# Patient Record
Sex: Female | Born: 1990 | Race: White | Hispanic: No | Marital: Single | Smoking: Former smoker
Health system: Southern US, Community
[De-identification: ages and names within clinical notes are randomized; demographics above are authoritative.]

## PROBLEM LIST (undated history)

## (undated) DIAGNOSIS — N926 Irregular menstruation, unspecified: Secondary | ICD-10-CM

## (undated) DIAGNOSIS — E669 Obesity, unspecified: Secondary | ICD-10-CM

## (undated) DIAGNOSIS — Z1371 Encounter for nonprocreative screening for genetic disease carrier status: Secondary | ICD-10-CM

## (undated) DIAGNOSIS — R87620 Atypical squamous cells of undetermined significance on cytologic smear of vagina (ASC-US): Secondary | ICD-10-CM

## (undated) DIAGNOSIS — F32A Depression, unspecified: Secondary | ICD-10-CM

## (undated) DIAGNOSIS — E282 Polycystic ovarian syndrome: Secondary | ICD-10-CM

## (undated) DIAGNOSIS — L680 Hirsutism: Secondary | ICD-10-CM

## (undated) DIAGNOSIS — F419 Anxiety disorder, unspecified: Secondary | ICD-10-CM

## (undated) DIAGNOSIS — F329 Major depressive disorder, single episode, unspecified: Secondary | ICD-10-CM

## (undated) DIAGNOSIS — Z8041 Family history of malignant neoplasm of ovary: Secondary | ICD-10-CM

## (undated) DIAGNOSIS — L0292 Furuncle, unspecified: Secondary | ICD-10-CM

## (undated) HISTORY — DX: Irregular menstruation, unspecified: N92.6

## (undated) HISTORY — DX: Furuncle, unspecified: L02.92

## (undated) HISTORY — DX: Family history of malignant neoplasm of ovary: Z80.41

## (undated) HISTORY — PX: TONSILLECTOMY AND ADENOIDECTOMY: SHX28

## (undated) HISTORY — DX: Hirsutism: L68.0

## (undated) HISTORY — DX: Anxiety disorder, unspecified: F41.9

## (undated) HISTORY — DX: Encounter for nonprocreative screening for genetic disease carrier status: Z13.71

## (undated) HISTORY — DX: Major depressive disorder, single episode, unspecified: F32.9

## (undated) HISTORY — DX: Atypical squamous cells of undetermined significance on cytologic smear of vagina (ASC-US): R87.620

## (undated) HISTORY — DX: Obesity, unspecified: E66.9

## (undated) HISTORY — DX: Depression, unspecified: F32.A

## (undated) SURGERY — LAPAROSCOPIC CHOLECYSTECTOMY WITH INTRAOPERATIVE CHOLANGIOGRAM
Anesthesia: General

---

## 2013-08-25 ENCOUNTER — Ambulatory Visit: Payer: Self-pay | Admitting: Emergency Medicine

## 2013-10-27 LAB — CBC AND DIFFERENTIAL
HCT: 38 % (ref 36–46)
HEMOGLOBIN: 12.4 g/dL (ref 12.0–16.0)
PLATELETS: 275 10*3/uL (ref 150–399)
WBC: 8.5 10*3/mL

## 2013-10-27 LAB — BASIC METABOLIC PANEL
BUN: 6 mg/dL (ref 4–21)
CREATININE: 0.9 mg/dL (ref 0.5–1.1)
Glucose: 87 mg/dL
Potassium: 4.6 mmol/L (ref 3.4–5.3)
Sodium: 138 mmol/L (ref 137–147)

## 2013-10-27 LAB — HEPATIC FUNCTION PANEL
ALT: 27 U/L (ref 7–35)
AST: 23 U/L (ref 13–35)

## 2015-03-06 DIAGNOSIS — Z1371 Encounter for nonprocreative screening for genetic disease carrier status: Secondary | ICD-10-CM

## 2015-03-06 HISTORY — DX: Encounter for nonprocreative screening for genetic disease carrier status: Z13.71

## 2015-05-05 ENCOUNTER — Encounter: Payer: Self-pay | Admitting: Physician Assistant

## 2015-05-05 ENCOUNTER — Ambulatory Visit (INDEPENDENT_AMBULATORY_CARE_PROVIDER_SITE_OTHER): Payer: BLUE CROSS/BLUE SHIELD | Admitting: Physician Assistant

## 2015-05-05 VITALS — BP 120/88 | HR 99 | Temp 97.9°F | Resp 16 | Wt 300.8 lb

## 2015-05-05 DIAGNOSIS — J069 Acute upper respiratory infection, unspecified: Secondary | ICD-10-CM

## 2015-05-05 DIAGNOSIS — H66002 Acute suppurative otitis media without spontaneous rupture of ear drum, left ear: Secondary | ICD-10-CM | POA: Diagnosis not present

## 2015-05-05 DIAGNOSIS — F329 Major depressive disorder, single episode, unspecified: Secondary | ICD-10-CM | POA: Insufficient documentation

## 2015-05-05 DIAGNOSIS — N926 Irregular menstruation, unspecified: Secondary | ICD-10-CM | POA: Insufficient documentation

## 2015-05-05 DIAGNOSIS — F32A Depression, unspecified: Secondary | ICD-10-CM | POA: Insufficient documentation

## 2015-05-05 DIAGNOSIS — R05 Cough: Secondary | ICD-10-CM | POA: Diagnosis not present

## 2015-05-05 DIAGNOSIS — R059 Cough, unspecified: Secondary | ICD-10-CM

## 2015-05-05 DIAGNOSIS — O039 Complete or unspecified spontaneous abortion without complication: Secondary | ICD-10-CM | POA: Insufficient documentation

## 2015-05-05 MED ORDER — AMOXICILLIN 875 MG PO TABS: 875.0000 mg | ORAL_TABLET | Freq: Two times a day (BID) | ORAL | Status: DC

## 2015-05-05 MED ORDER — HYDROCODONE-HOMATROPINE 5-1.5 MG/5ML PO SYRP: 5.0000 mL | ORAL_SOLUTION | Freq: Three times a day (TID) | ORAL | Status: DC | PRN

## 2015-05-05 NOTE — Patient Instructions (Signed)
Upper Respiratory Infection, Adult Most upper respiratory infections (URIs) are a viral infection of the air passages leading to the lungs. A URI affects the nose, throat, and upper air passages. The most common type of URI is nasopharyngitis and is typically referred to as "the common cold." URIs run their course and usually go away on their own. Most of the time, a URI does not require medical attention, but sometimes a bacterial infection in the upper airways can follow a viral infection. This is called a secondary infection. Sinus and middle ear infections are common types of secondary upper respiratory infections. Bacterial pneumonia can also complicate a URI. A URI can worsen asthma and chronic obstructive pulmonary disease (COPD). Sometimes, these complications can require emergency medical care and may be life threatening.  CAUSES Almost all URIs are caused by viruses. A virus is a type of germ and can spread from one person to another.  RISKS FACTORS You may be at risk for a URI if:   You smoke.   You have chronic heart or lung disease.  You have a weakened defense (immune) system.   You are very young or very old.   You have nasal allergies or asthma.  You work in crowded or poorly ventilated areas.  You work in health care facilities or schools. SIGNS AND SYMPTOMS  Symptoms typically develop 2-3 days after you come in contact with a cold virus. Most viral URIs last 7-10 days. However, viral URIs from the influenza virus (flu virus) can last 14-18 days and are typically more severe. Symptoms may include:   Runny or stuffy (congested) nose.   Sneezing.   Cough.   Sore throat.   Headache.   Fatigue.   Fever.   Loss of appetite.   Pain in your forehead, behind your eyes, and over your cheekbones (sinus pain).  Muscle aches.  DIAGNOSIS  Your health care provider may diagnose a URI by:  Physical exam.  Tests to check that your symptoms are not due to  another condition such as:  Strep throat.  Sinusitis.  Pneumonia.  Asthma. TREATMENT  A URI goes away on its own with time. It cannot be cured with medicines, but medicines may be prescribed or recommended to relieve symptoms. Medicines may help:  Reduce your fever.  Reduce your cough.  Relieve nasal congestion. HOME CARE INSTRUCTIONS   Take medicines only as directed by your health care provider.   Gargle warm saltwater or take cough drops to comfort your throat as directed by your health care provider.  Use a warm mist humidifier or inhale steam from a shower to increase air moisture. This may make it easier to breathe.  Drink enough fluid to keep your urine clear or pale yellow.   Eat soups and other clear broths and maintain good nutrition.   Rest as needed.   Return to work when your temperature has returned to normal or as your health care provider advises. You may need to stay home longer to avoid infecting others. You can also use a face mask and careful hand washing to prevent spread of the virus.  Increase the usage of your inhaler if you have asthma.   Do not use any tobacco products, including cigarettes, chewing tobacco, or electronic cigarettes. If you need help quitting, ask your health care provider. PREVENTION  The best way to protect yourself from getting a cold is to practice good hygiene.   Avoid oral or hand contact with people with cold   symptoms.   Wash your hands often if contact occurs.  There is no clear evidence that vitamin C, vitamin E, echinacea, or exercise reduces the chance of developing a cold. However, it is always recommended to get plenty of rest, exercise, and practice good nutrition.  SEEK MEDICAL CARE IF:   You are getting worse rather than better.   Your symptoms are not controlled by medicine.   You have chills.  You have worsening shortness of breath.  You have brown or red mucus.  You have yellow or brown nasal  discharge.  You have pain in your face, especially when you bend forward.  You have a fever.  You have swollen neck glands.  You have pain while swallowing.  You have white areas in the back of your throat. SEEK IMMEDIATE MEDICAL CARE IF:   You have severe or persistent:  Headache.  Ear pain.  Sinus pain.  Chest pain.  You have chronic lung disease and any of the following:  Wheezing.  Prolonged cough.  Coughing up blood.  A change in your usual mucus.  You have a stiff neck.  You have changes in your:  Vision.  Hearing.  Thinking.  Mood. MAKE SURE YOU:   Understand these instructions.  Will watch your condition.  Will get help right away if you are not doing well or get worse.   This information is not intended to replace advice given to you by your health care provider. Make sure you discuss any questions you have with your health care provider.   Document Released: 08/15/2000 Document Revised: 07/06/2014 Document Reviewed: 05/27/2013 Elsevier Interactive Patient Education 2016 [employer] Otitis Media With Effusion Otitis media with effusion is the presence of fluid in the middle ear. This is a common problem in children, which often follows ear infections. It may be present for weeks or longer after the infection. Unlike an acute ear infection, otitis media with effusion refers only to fluid behind the ear drum and not infection. Children with repeated ear and sinus infections and allergy problems are the most likely to get otitis media with effusion. CAUSES  The most frequent cause of the fluid buildup is dysfunction of the eustachian tubes. These are the tubes that drain fluid in the ears to the back of the nose (nasopharynx). SYMPTOMS   The main symptom of this condition is hearing loss. As a result, you or your child may:  Listen to the TV at a loud volume.  Not respond to questions.  Ask "what" often when spoken to.  Mistake or confuse  one sound or word for another.  There may be a sensation of fullness or pressure but usually not pain. DIAGNOSIS   Your health care provider will diagnose this condition by examining you or your child's ears.  Your health care provider may test the pressure in you or your child's ear with a tympanometer.  A hearing test may be conducted if the problem persists. TREATMENT   Treatment depends on the duration and the effects of the effusion.  Antibiotics, decongestants, nose drops, and cortisone-type drugs (tablets or nasal spray) may not be helpful.  Children with persistent ear effusions may have delayed language or behavioral problems. Children at risk for developmental delays in hearing, learning, and speech may require referral to a specialist earlier than children not at risk.  You or your child's health care provider may suggest a referral to an ear, nose, and throat surgeon for treatment. The following may  help restore normal hearing:  Drainage of fluid.  Placement of ear tubes (tympanostomy tubes).  Removal of adenoids (adenoidectomy). HOME CARE INSTRUCTIONS   Avoid secondhand smoke.  Infants who are breastfed are less likely to have this condition.  Avoid feeding infants while they are lying flat.  Avoid known environmental allergens.  Avoid people who are sick. SEEK MEDICAL CARE IF:   Hearing is not better in 3 months.  Hearing is worse.  Ear pain.  Drainage from the ear.  Dizziness. MAKE SURE YOU:   Understand these instructions.  Will watch your condition.  Will get help right away if you are not doing well or get worse.   This information is not intended to replace advice given to you by your health care provider. Make sure you discuss any questions you have with your health care provider.   Document Released: 03/29/2004 Document Revised: 03/12/2014 Document Reviewed: 09/16/2012 Elsevier Interactive Patient Education [employer]

## 2015-05-05 NOTE — Progress Notes (Signed)
Patient: Jill Moon Female    DOB: December 29, 1990   24 y.o.   MRN: [id number] Visit Date: 05/05/2015  Today's Provider: Margaretann Loveless, PA-C   Chief Complaint  Patient presents with  . Cough   Subjective:    Cough This is a new problem. The current episode started in the past 7 days (On Sunday). The problem has been gradually worsening. The problem occurs constantly. The cough is non-productive. Associated symptoms include ear congestion (Left ear), ear pain, a fever (Low grade), nasal congestion, postnasal drip, rhinorrhea, a sore throat (on sunday scratchy), shortness of breath and wheezing. Pertinent negatives include no chest pain, chills or headaches. The symptoms are aggravated by lying down. She has tried OTC cough suppressant and body position changes (Mucinex, advil and Tylenol) for the symptoms. The treatment provided no relief.      No Known Allergies Previous Medications   RECLIPSEN 0.15-30 MG-MCG TABLET        Review of Systems  Constitutional: Positive for fever (Low grade). Negative for chills.  HENT: Positive for congestion, ear pain, postnasal drip, rhinorrhea, sinus pressure, sore throat (on sunday scratchy) and voice change. Negative for trouble swallowing.   Respiratory: Positive for cough, chest tightness, shortness of breath and wheezing.   Cardiovascular: Negative for chest pain and palpitations.  Gastrointestinal: Negative for nausea, vomiting, abdominal pain and diarrhea.  Neurological: Negative for dizziness and headaches.    Social History  Substance Use Topics  . Smoking status: Light Tobacco Smoker  . Smokeless tobacco: Not on file  . Alcohol Use: Yes     Comment: occasional   Objective:   BP 120/88 mmHg  Pulse 99  Temp(Src) 97.9 F (36.6 C) (Oral)  Resp 16  Wt 300 lb 12.8 oz (136.442 kg)  LMP 05/05/2015  Physical Exam  Constitutional: She appears well-developed and well-nourished. No distress.  HENT:  Head:  Normocephalic and atraumatic.  Right Ear: Hearing, tympanic membrane, external ear and ear canal normal. No middle ear effusion.  Left Ear: Hearing, external ear and ear canal normal. Tympanic membrane is erythematous and bulging. A middle ear effusion is present.  Nose: Mucosal edema and rhinorrhea present. Right sinus exhibits no maxillary sinus tenderness and no frontal sinus tenderness. Left sinus exhibits no maxillary sinus tenderness and no frontal sinus tenderness.  Mouth/Throat: Uvula is midline, oropharynx is clear and moist and mucous membranes are normal. No oropharyngeal exudate, posterior oropharyngeal edema or posterior oropharyngeal erythema.  Eyes: Conjunctivae are normal. Pupils are equal, round, and reactive to light. Right eye exhibits no discharge. Left eye exhibits no discharge. No scleral icterus.  Neck: Normal range of motion. Neck supple. No tracheal deviation present. No thyromegaly present.  Cardiovascular: Normal rate, regular rhythm and normal heart sounds.  Exam reveals no gallop and no friction rub.   No murmur heard. Pulmonary/Chest: Effort normal and breath sounds normal. No stridor. No respiratory distress. She has no wheezes. She has no rales.  Lymphadenopathy:    She has no cervical adenopathy.  Skin: Skin is warm and dry. She is not diaphoretic.  Vitals reviewed.       Assessment & Plan:     1. Acute suppurative otitis media of left ear without spontaneous rupture of tympanic membrane, recurrence not specified Worsening. Will treat with amoxicillin as below.  Advised she may take tylenol as needed for fever and pain. She is to stay well hydrated and get plenty of rest. She is to call  the office if symptoms fail to improve or worsen. - amoxicillin (AMOXIL) 875 MG tablet; Take 1 tablet (875 mg total) by mouth 2 (two) times daily.  Dispense: 20 tablet; Refill: 0  2. Cough Worsening nighttime cough when she lies down that is affecting sleep. Will give hycodan  cough syrup as below. She needs to stay well hydrated. Advised of drowsiness precautions with medication. She is to call the office if symptoms fail to improve or worsen. - HYDROcodone-homatropine (HYCODAN) 5-1.5 MG/5ML syrup; Take 5 mLs by mouth every 8 (eight) hours as needed for cough.  Dispense: 180 mL; Refill: 0  3. Upper respiratory infection Worsening. She is to stay well hydrated and get plenty of rest. She may take tylenol for fever and pain. She may continue mucinex for congestion. She is to call the office if symptoms fail to improve or worsen.       Margaretann Loveless, PA-C  [facility]

## 2015-05-31 ENCOUNTER — Encounter: Payer: Self-pay | Admitting: Physician Assistant

## 2015-05-31 ENCOUNTER — Ambulatory Visit (INDEPENDENT_AMBULATORY_CARE_PROVIDER_SITE_OTHER): Payer: BLUE CROSS/BLUE SHIELD | Admitting: Physician Assistant

## 2015-05-31 VITALS — BP 110/76 | HR 70 | Temp 97.8°F | Resp 16 | Wt 300.6 lb

## 2015-05-31 DIAGNOSIS — F329 Major depressive disorder, single episode, unspecified: Secondary | ICD-10-CM | POA: Diagnosis not present

## 2015-05-31 DIAGNOSIS — E282 Polycystic ovarian syndrome: Secondary | ICD-10-CM | POA: Diagnosis not present

## 2015-05-31 DIAGNOSIS — F32A Depression, unspecified: Secondary | ICD-10-CM

## 2015-05-31 MED ORDER — ESCITALOPRAM OXALATE 10 MG PO TABS: ORAL_TABLET | ORAL | Status: DC

## 2015-05-31 MED ORDER — METFORMIN HCL 500 MG PO TABS: 500.0000 mg | ORAL_TABLET | Freq: Every day | ORAL | Status: DC

## 2015-05-31 NOTE — Progress Notes (Signed)
Patient ID: Jill Moon, female   DOB: 1990-11-21, 25 y.o.   MRN: [id number]       Patient: Jill Moon Female    DOB: 11-27-1990   24 y.o.   MRN: [id number] Visit Date: 05/31/2015  Today's Provider: Margaretann Loveless, PA-C   Chief Complaint  Patient presents with  . Anxiety   Subjective:    HPI  Patient comes in office today to address symptoms of anxiety for the past year Patient states that she feels hormonal and has had frequent crying spells almost on a daily basis.Patient states " my mind constantly runs, it feels like im on a roller coaster." Patient states that she has a past history of anxiety over several years ago when a relative had passed she was on Zoloft, she states that when she was on medication she felt "emotionless". Patient states currently in her life she has tremendous stress from work " feeling nervous all the time, and states that she has has insecurity with weight.      No Known Allergies Previous Medications   RECLIPSEN 0.15-30 MG-MCG TABLET        Review of Systems  Constitutional: Negative.   HENT: Negative.   Cardiovascular: Negative.   Gastrointestinal: Negative.   Neurological: Negative.   Psychiatric/Behavioral: Positive for sleep disturbance, dysphoric mood and decreased concentration. Negative for suicidal ideas and self-injury. The patient is nervous/anxious.     Social History  Substance Use Topics  . Smoking status: Light Tobacco Smoker  . Smokeless tobacco: Not on file  . Alcohol Use: Yes     Comment: occasional   Objective:   BP 110/76 mmHg  Pulse 70  Temp(Src) 97.8 F (36.6 C) (Oral)  Resp 16  Wt 300 lb 9.6 oz (136.351 kg)  LMP 05/05/2015  Depression screen PHQ 2/9 05/31/2015  Decreased Interest 1  Down, Depressed, Hopeless 1  PHQ - 2 Score 2  Altered sleeping 2  Tired, decreased energy 3  Change in appetite 3  Feeling bad or failure about yourself  3  Trouble concentrating 0  Moving slowly or  fidgety/restless 0  Suicidal thoughts 0  PHQ-9 Score 13  Difficult doing work/chores Somewhat difficult    Physical Exam  Constitutional: She appears well-developed and well-nourished. No distress.  Cardiovascular: Normal rate, regular rhythm and normal heart sounds.  Exam reveals no gallop and no friction rub.   No murmur heard. Pulmonary/Chest: Effort normal and breath sounds normal. No respiratory distress. She has no wheezes. She has no rales.  Skin: She is not diaphoretic.  Psychiatric: Her speech is normal and behavior is normal. Judgment and thought content normal. Her mood appears not anxious. Cognition and memory are normal. She exhibits a depressed mood.  Very tearful  Vitals reviewed.       Assessment & Plan:     1. Depression She has previously used Zoloft with some relief but stated she felt like Zoloft and weight all of her motion. I will try her on Lexapro as below instead. She is to call the office if she has any adverse reactions to the medication. If not I will see her back in 4 weeks to evaluate how she is doing with this new medication. - escitalopram (LEXAPRO) 10 MG tablet; Take 1/2 tab PO q h.s. X 1 week then 1 tab PO q h.s.  Dispense: 30 tablet; Refill: 1  2. PCOS (polycystic ovarian syndrome) Will add metformin as below for possible weight loss due to the  insulin resistance that is associated with PCOS. If we aren't able to get her mood stabilized with the Lexapro and if she is noticing some changes with metformin may consider discussing other weight loss options for her at next visit. - metFORMIN (GLUCOPHAGE) 500 MG tablet; Take 1 tablet (500 mg total) by mouth daily with breakfast.  Dispense: 30 tablet; Refill: 1       Margaretann LovelessJennifer M Autumnrose Yore, PA-C  [facility]

## 2015-05-31 NOTE — Patient Instructions (Signed)
Major Depressive Disorder Major depressive disorder is a mental illness. It also may be called clinical depression or unipolar depression. Major depressive disorder usually causes feelings of sadness, hopelessness, or helplessness. Some people with this disorder do not feel particularly sad but lose interest in doing things they used to enjoy (anhedonia). Major depressive disorder also can cause physical symptoms. It can interfere with work, school, relationships, and other normal everyday activities. The disorder varies in severity but is longer lasting and more serious than the sadness we all feel from time to time in our lives. Major depressive disorder often is triggered by stressful life events or major life changes. Examples of these triggers include divorce, loss of your job or home, a move, and the death of a family member or close friend. Sometimes this disorder occurs for no obvious reason at all. People who have family members with major depressive disorder or bipolar disorder are at higher risk for developing this disorder, with or without life stressors. Major depressive disorder can occur at any age. It may occur just once in your life (single episode major depressive disorder). It may occur multiple times (recurrent major depressive disorder). SYMPTOMS People with major depressive disorder have either anhedonia or depressed mood on nearly a daily basis for at least 2 weeks or longer. Symptoms of depressed mood include:  Feelings of sadness (blue or down in the dumps) or emptiness.  Feelings of hopelessness or helplessness.  Tearfulness or episodes of crying (may be observed by others).  Irritability (children and adolescents). In addition to depressed mood or anhedonia or both, people with this disorder have at least four of the following symptoms:  Difficulty sleeping or sleeping too much.   Significant change (increase or decrease) in appetite or weight.   Lack of energy or  motivation.  Feelings of guilt and worthlessness.   Difficulty concentrating, remembering, or making decisions.  Unusually slow movement (psychomotor retardation) or restlessness (as observed by others).   Recurrent wishes for death, recurrent thoughts of self-harm (suicide), or a suicide attempt. People with major depressive disorder commonly have persistent negative thoughts about themselves, other people, and the world. People with severe major depressive disorder may experiencedistorted beliefs or perceptions about the world (psychotic delusions). They also may see or hear things that are not real (psychotic hallucinations). DIAGNOSIS Major depressive disorder is diagnosed through an assessment by your health care provider. Your health care provider will ask aboutaspects of your daily life, such as mood,sleep, and appetite, to see if you have the diagnostic symptoms of major depressive disorder. Your health care provider may ask about your medical history and use of alcohol or drugs, including prescription medicines. Your health care provider also may do a physical exam and blood work. This is because certain medical conditions and the use of certain substances can cause major depressive disorder-like symptoms (secondary depression). Your health care provider also may refer you to a mental health specialist for further evaluation and treatment. TREATMENT It is important to recognize the symptoms of major depressive disorder and seek treatment. The following treatments can be prescribed for this disorder:   Medicine. Antidepressant medicines usually are prescribed. Antidepressant medicines are thought to correct chemical imbalances in the brain that are commonly associated with major depressive disorder. Other types of medicine may be added if the symptoms do not respond to antidepressant medicines alone or if psychotic delusions or hallucinations occur.  Talk therapy. Talk therapy can be  helpful in treating major depressive disorder by providing   support, education, and guidance. Certain types of talk therapy also can help with negative thinking (cognitive behavioral therapy) and with relationship issues that trigger this disorder (interpersonal therapy). A mental health specialist can help determine which treatment is best for you. Most people with major depressive disorder do well with a combination of medicine and talk therapy. Treatments involving electrical stimulation of the brain can be used in situations with extremely severe symptoms or when medicine and talk therapy do not work over time. These treatments include electroconvulsive therapy, transcranial magnetic stimulation, and vagal nerve stimulation.   This information is not intended to replace advice given to you by your health care provider. Make sure you discuss any questions you have with your health care provider.   Document Released: 06/16/2012 Document Revised: 03/12/2014 Document Reviewed: 06/16/2012 Elsevier Interactive Patient Education 2016 Elsevier Inc.  Escitalopram tablets What is this medicine? ESCITALOPRAM (es sye TAL oh pram) is used to treat depression and certain types of anxiety. This medicine may be used for other purposes; ask your health care provider or pharmacist if you have questions. What should I tell my health care provider before I take this medicine? They need to know if you have any of these conditions: -bipolar disorder or a family history of bipolar disorder -diabetes -glaucoma -heart disease -kidney or liver disease -receiving electroconvulsive therapy -seizures (convulsions) -suicidal thoughts, plans, or attempt by you or a family member -an unusual or allergic reaction to escitalopram, the related drug citalopram, other medicines, foods, dyes, or preservatives -pregnant or trying to become pregnant -breast-feeding How should I use this medicine? Take this medicine by mouth  with a glass of water. Follow the directions on the prescription label. You can take it with or without food. If it upsets your stomach, take it with food. Take your medicine at regular intervals. Do not take it more often than directed. Do not stop taking this medicine suddenly except upon the advice of your doctor. Stopping this medicine too quickly may cause serious side effects or your condition may worsen. A special MedGuide will be given to you by the pharmacist with each prescription and refill. Be sure to read this information carefully each time. Talk to your pediatrician regarding the use of this medicine in children. Special care may be needed. Overdosage: If you think you have taken too much of this medicine contact a poison control center or emergency room at once. NOTE: This medicine is only for you. Do not share this medicine with others. What if I miss a dose? If you miss a dose, take it as soon as you can. If it is almost time for your next dose, take only that dose. Do not take double or extra doses. What may interact with this medicine? Do not take this medicine with any of the following medications: -certain medicines for fungal infections like fluconazole, itraconazole, ketoconazole, posaconazole, voriconazole -cisapride -citalopram -dofetilide -dronedarone -linezolid -MAOIs like Carbex, Eldepryl, Marplan, Nardil, and Parnate -methylene blue (injected into a vein) -pimozide -thioridazine -ziprasidone This medicine may also interact with the following medications: -alcohol -aspirin and aspirin-like medicines -carbamazepine -certain medicines for depression, anxiety, or psychotic disturbances -certain medicines for migraine headache like almotriptan, eletriptan, frovatriptan, naratriptan, rizatriptan, sumatriptan, zolmitriptan -certain medicines for sleep -certain medicines that treat or prevent blood clots like warfarin, enoxaparin,  dalteparin -cimetidine -diuretics -fentanyl -furazolidone -isoniazid -lithium -metoprolol -NSAIDs, medicines for pain and inflammation, like ibuprofen or naproxen -other medicines that prolong the QT interval (cause an abnormal heart rhythm) -  procarbazine -rasagiline -supplements like St. John's wort, kava kava, valerian -tramadol -tryptophan This list may not describe all possible interactions. Give your health care provider a list of all the medicines, herbs, non-prescription drugs, or dietary supplements you use. Also tell them if you smoke, drink alcohol, or use illegal drugs. Some items may interact with your medicine. What should I watch for while using this medicine? Tell your doctor if your symptoms do not get better or if they get worse. Visit your doctor or health care professional for regular checks on your progress. Because it may take several weeks to see the full effects of this medicine, it is important to continue your treatment as prescribed by your doctor. Patients and their families should watch out for new or worsening thoughts of suicide or depression. Also watch out for sudden changes in feelings such as feeling anxious, agitated, panicky, irritable, hostile, aggressive, impulsive, severely restless, overly excited and hyperactive, or not being able to sleep. If this happens, especially at the beginning of treatment or after a change in dose, call your health care professional. You may get drowsy or dizzy. Do not drive, use machinery, or do anything that needs mental alertness until you know how this medicine affects you. Do not stand or sit up quickly, especially if you are an older patient. This reduces the risk of dizzy or fainting spells. Alcohol may interfere with the effect of this medicine. Avoid alcoholic drinks. Your mouth may get dry. Chewing sugarless gum or sucking hard candy, and drinking plenty of water may help. Contact your doctor if the problem does not go  away or is severe. What side effects may I notice from receiving this medicine? Side effects that you should report to your doctor or health care professional as soon as possible: -allergic reactions like skin rash, itching or hives, swelling of the face, lips, or tongue -confusion -feeling faint or lightheaded, falls -fast talking and excited feelings or actions that are out of control -hallucination, loss of contact with reality -seizures -suicidal thoughts or other mood changes -unusual bleeding or bruising Side effects that usually do not require medical attention (report to your doctor or health care professional if they continue or are bothersome): -blurred vision -changes in appetite -change in sex drive or performance -headache -increased sweating -nausea This list may not describe all possible side effects. Call your doctor for medical advice about side effects. You may report side effects to FDA at 1-800-FDA-1088. Where should I keep my medicine? Keep out of reach of children. Store at room temperature between 15 and 30 degrees C (59 and 86 degrees F). Throw away any unused medicine after the expiration date. NOTE: This sheet is a summary. It may not cover all possible information. If you have questions about this medicine, talk to your doctor, pharmacist, or health care provider.    2016, Elsevier/Gold Standard. (2012-09-16 12:32:55)   

## 2015-06-28 ENCOUNTER — Ambulatory Visit (INDEPENDENT_AMBULATORY_CARE_PROVIDER_SITE_OTHER): Payer: BLUE CROSS/BLUE SHIELD | Admitting: Physician Assistant

## 2015-06-28 ENCOUNTER — Encounter: Payer: Self-pay | Admitting: Physician Assistant

## 2015-06-28 DIAGNOSIS — F32A Depression, unspecified: Secondary | ICD-10-CM

## 2015-06-28 DIAGNOSIS — F329 Major depressive disorder, single episode, unspecified: Secondary | ICD-10-CM

## 2015-06-28 DIAGNOSIS — E282 Polycystic ovarian syndrome: Secondary | ICD-10-CM

## 2015-06-28 DIAGNOSIS — Z713 Dietary counseling and surveillance: Secondary | ICD-10-CM

## 2015-06-28 DIAGNOSIS — Z6841 Body Mass Index (BMI) 40.0 and over, adult: Secondary | ICD-10-CM | POA: Diagnosis not present

## 2015-06-28 MED ORDER — ESCITALOPRAM OXALATE 10 MG PO TABS: 10.0000 mg | ORAL_TABLET | Freq: Every day | ORAL | Status: DC

## 2015-06-28 MED ORDER — METFORMIN HCL 500 MG PO TABS: 500.0000 mg | ORAL_TABLET | Freq: Every day | ORAL | Status: DC

## 2015-06-28 MED ORDER — PHENTERMINE HCL 37.5 MG PO CAPS: 37.5000 mg | ORAL_CAPSULE | ORAL | Status: DC

## 2015-06-28 NOTE — Patient Instructions (Signed)

## 2015-06-28 NOTE — Progress Notes (Signed)
Patient: Jill Moon Female    DOB: 1990-12-23   25 y.o.   MRN: [id number] Visit Date: 06/28/2015  Today's Provider: Margaretann LovelessJennifer M Burnette, PA-C   Chief Complaint  Patient presents with  . Follow-up    Depression and PCOS   Subjective:    HPI Depression: Patient is here for her 4 week follow-up. She was prescribed Lexapro 10mg  at last office visit. She reports the Lexapro is helping. She doesn't cry as much and doesn't have the mood swings any more. She still gets anxious and stressed because of work, but not as much as it was before. She is also up for a promotion at work and she feels this will help her stress and anxiety at work.  PCOS(Polycystic Ovarian Syndrome): She is following up the addition of metformin for PCOS.  Metformin was added in the last office visit to help with her weight due to the insulin resistance that is associated with PCOS. She reports the Metformin makes her feel hungry. She also complains of diarrhea that may be associated but states she has had issues with diarrhea before she started the metformin. She is concerned that she may have IBS. She reports that 30 minutes after eating she has the urge to go to the bathroom. She reports having loose bowel movements anywhere from 3-6 times per day. Her mother also has IBS-D.  She also has concerns of the oral contraceptive she is currently on for her PCOS. She states she has been on Reclipsen for the last 3 years since being diagnosed with PCOS. [facility]. She is interested in possibly switching her OCPs as well. She feels the Reclipsen never synched well with her and she still has irregular menses and mood swings.  She also reports that she is not feeling well today and she thinks she is getting the stomach bug that her mother had yesterday.  Obesity: Patient complains of obesity. Patient cites health, increased physical ability, and self-image as reasons for wanting to lose  weight.  Obesity History Weight in late teens: 170 lb. Period of greatest weight gain:100 lb during between the age of 20-present. Lowest adult weight: n/a Highest adult weight: 301.2 lbs   History of Weight Loss Efforts Greatest amount of weight lost: 20 lb over 2 months Amount of time that loss was maintained: 1 months Circumstances associated with regain of weight: Depression, not eating correctly or exercising. Successful weight loss techniques attempted: going to the Gym Unsuccessful weight loss techniques attempted: Weight Watchers  Current Exercise Habits none  Current Eating Habits Number of regular meals per day: 3 Number of snacking episodes per day: 1 Who shops for food? patient Who prepares food? patient Who eats with patient? patient, mother and boyfriend Binge behavior?: yes - "she eats when she gets in her depressed mood she goes to sweets.(candy, ice cream etc.) Purge behavior? no Anorexic behavior? no Eating precipitated by stress? yes  Guilt feelings associated with eating? no  Other Potential Contributing Factors Use of alcohol: average sometimes/social twice a year. History of past abuse? none Psych History: depression, mood swings and obesity     No Known Allergies Previous Medications   ESCITALOPRAM (LEXAPRO) 10 MG TABLET    Take 1/2 tab PO q h.s. X 1 week then 1 tab PO q h.s.   METFORMIN (GLUCOPHAGE) 500 MG TABLET    Take 1 tablet (500 mg total) by mouth daily with breakfast.   RECLIPSEN  0.15-30 MG-MCG TABLET        Review of Systems  Constitutional: Negative for fever, chills and fatigue.  HENT: Negative.   Respiratory: Negative.   Cardiovascular: Negative.   Gastrointestinal: Positive for nausea, diarrhea and abdominal distention. Negative for vomiting, constipation and blood in stool.  Genitourinary: Negative.   Musculoskeletal: Negative.   Neurological: Negative for dizziness and headaches.  Psychiatric/Behavioral: Positive for decreased  concentration (toward the end of the day.). Negative for suicidal ideas, sleep disturbance and dysphoric mood. The patient is not nervous/anxious.     Social History  Substance Use Topics  . Smoking status: Light Tobacco Smoker  . Smokeless tobacco: Not on file  . Alcohol Use: Yes     Comment: occasional   Objective:   BP 180/80 mmHg  Pulse 81  Temp(Src) 97.8 F (36.6 C) (Oral)  Resp 16  Wt 301 lb 3.2 oz (136.623 kg)  LMP 06/26/2015  Physical Exam  Constitutional: She appears well-developed and well-nourished. No distress.  Neck: Normal range of motion. Neck supple.  Cardiovascular: Normal rate, regular rhythm and normal heart sounds.  Exam reveals no gallop and no friction rub.   No murmur heard. Pulmonary/Chest: Effort normal and breath sounds normal. No respiratory distress. She has no wheezes. She has no rales.  Abdominal: Soft. Bowel sounds are normal. She exhibits no distension and no mass. There is no tenderness. There is no rebound and no guarding.  Skin: She is not diaphoretic.  Psychiatric: She has a normal mood and affect. Her behavior is normal. Judgment and thought content normal.  Vitals reviewed.       Assessment & Plan:     1. Depression Will continue lexapro as she has been doing well with this dose. She is to call if she has any symptom breakthrough in the meantime.  - escitalopram (LEXAPRO) 10 MG tablet; Take 1 tablet (10 mg total) by mouth at bedtime.  Dispense: 30 tablet; Refill: 6  2. PCOS (polycystic ovarian syndrome) Will continue metformin as she feels this does help with the mood swings some and wants to continue for the insulin resistance with PCOS. As for her OCP (reclipsen) I advised her to call her Ob/Gyn at [facility] to inform them that she is having issues and would like a change. She agrees to do so.  - metFORMIN (GLUCOPHAGE) 500 MG tablet; Take 1 tablet (500 mg total) by mouth daily with breakfast.  Dispense: 30 tablet; Refill: 6  3.  Encounter for weight loss counseling She is already starting to go to the gym and is trying to restart a food diary. She has used MyFitnessPal in the past but may try a different platform this time. We discussed many others she may could use. Advised to try a 1400-1500 calorie diet and continue to go to the gym as she is currently doing. She is going to be starting a bootcamp class in 3-4 weeks as well. I will add phentermine as below for appetite suppression. I will see her back in 4 weeks for weight check. She is to call the office in the meantime if she has any worsening symptoms or adverse reaction to the medication. - phentermine 37.5 MG capsule; Take 1 capsule (37.5 mg total) by mouth every morning.  Dispense: 30 capsule; Refill: 0  4. Morbid obesity due to excess calories (HCC) See above medical treatment plan for #3. - phentermine 37.5 MG capsule; Take 1 capsule (37.5 mg total) by mouth every morning.  Dispense: 30 capsule;  Refill: 0  5. BMI 40.0-44.9, adult [facility]) See above medical treatment plan for #3.  I spent approximately 45 minutes with the patient today. Over 50% of this time was spent with counseling and educating the patient on weight loss.       Margaretann Loveless, PA-C  [facility]

## 2015-07-08 ENCOUNTER — Emergency Department: Payer: BLUE CROSS/BLUE SHIELD

## 2015-07-08 ENCOUNTER — Emergency Department
Admission: EM | Admit: 2015-07-08 | Discharge: 2015-07-08 | Disposition: A | Discharge status: Home / Self Care | Payer: BLUE CROSS/BLUE SHIELD | Attending: Emergency Medicine | Admitting: Emergency Medicine

## 2015-07-08 DIAGNOSIS — K8020 Calculus of gallbladder without cholecystitis without obstruction: Secondary | ICD-10-CM | POA: Insufficient documentation

## 2015-07-08 DIAGNOSIS — K589 Irritable bowel syndrome without diarrhea: Secondary | ICD-10-CM | POA: Diagnosis not present

## 2015-07-08 DIAGNOSIS — F329 Major depressive disorder, single episode, unspecified: Secondary | ICD-10-CM | POA: Diagnosis not present

## 2015-07-08 DIAGNOSIS — R112 Nausea with vomiting, unspecified: Secondary | ICD-10-CM | POA: Diagnosis not present

## 2015-07-08 DIAGNOSIS — R1030 Lower abdominal pain, unspecified: Secondary | ICD-10-CM | POA: Diagnosis not present

## 2015-07-08 DIAGNOSIS — R1084 Generalized abdominal pain: Secondary | ICD-10-CM

## 2015-07-08 DIAGNOSIS — F17200 Nicotine dependence, unspecified, uncomplicated: Secondary | ICD-10-CM | POA: Insufficient documentation

## 2015-07-08 DIAGNOSIS — E669 Obesity, unspecified: Secondary | ICD-10-CM | POA: Diagnosis not present

## 2015-07-08 DIAGNOSIS — K580 Irritable bowel syndrome with diarrhea: Secondary | ICD-10-CM | POA: Diagnosis not present

## 2015-07-08 HISTORY — DX: Polycystic ovarian syndrome: E28.2

## 2015-07-08 LAB — COMPREHENSIVE METABOLIC PANEL
ALBUMIN: 3.9 g/dL (ref 3.5–5.0)
ALK PHOS: 21 U/L — AB (ref 38–126)
ALT: 27 U/L (ref 14–54)
ANION GAP: 10 (ref 5–15)
AST: 20 U/L (ref 15–41)
BUN: 8 mg/dL (ref 6–20)
CALCIUM: 9.5 mg/dL (ref 8.9–10.3)
CO2: 22 mmol/L (ref 22–32)
CREATININE: 0.79 mg/dL (ref 0.44–1.00)
Chloride: 108 mmol/L (ref 101–111)
GFR calc Af Amer: >60 mL/min (ref >60)
GFR calc non Af Amer: >60 mL/min (ref >60)
GLUCOSE: 121 mg/dL — AB (ref 65–99)
Potassium: 4.1 mmol/L (ref 3.5–5.1)
SODIUM: 140 mmol/L (ref 135–145)
Total Bilirubin: 0.2 mg/dL — ABNORMAL LOW (ref 0.3–1.2)
Total Protein: 7.8 g/dL (ref 6.5–8.1)

## 2015-07-08 LAB — CBC
HCT: 37 % (ref 35.0–47.0)
HEMOGLOBIN: 12.2 g/dL (ref 12.0–16.0)
MCH: 25.4 pg — AB (ref 26.0–34.0)
MCHC: 33 g/dL (ref 32.0–36.0)
MCV: 77.1 fL — ABNORMAL LOW (ref 80.0–100.0)
Platelets: 306 10*3/uL (ref 150–440)
RBC: 4.8 MIL/uL (ref 3.80–5.20)
RDW: 15.6 % — ABNORMAL HIGH (ref 11.5–14.5)
WBC: 10.1 10*3/uL (ref 3.6–11.0)

## 2015-07-08 LAB — URINALYSIS COMPLETE WITH MICROSCOPIC (ARMC ONLY)
Bacteria, UA: NONE SEEN
Bilirubin Urine: NEGATIVE
Glucose, UA: NEGATIVE mg/dL
Hgb urine dipstick: NEGATIVE
Nitrite: NEGATIVE
PROTEIN: 30 mg/dL — AB
Specific Gravity, Urine: 1.029 (ref 1.005–1.030)
pH: 5 (ref 5.0–8.0)

## 2015-07-08 LAB — POCT PREGNANCY, URINE: PREG TEST UR: NEGATIVE

## 2015-07-08 LAB — LIPASE, BLOOD: Lipase: 20 U/L (ref 11–51)

## 2015-07-08 MED ORDER — ONDANSETRON HCL 4 MG/2ML IJ SOLN
INTRAMUSCULAR | Status: AC
  Administered 2015-07-08: 4 mg via INTRAVENOUS
  Filled 2015-07-08: qty 2

## 2015-07-08 MED ORDER — ONDANSETRON HCL 4 MG/2ML IJ SOLN
4.0000 mg | Freq: Once | INTRAMUSCULAR | Status: AC | PRN
  Administered 2015-07-08: 4 mg via INTRAVENOUS

## 2015-07-08 MED ORDER — PROCHLORPERAZINE EDISYLATE 5 MG/ML IJ SOLN
10.0000 mg | Freq: Once | INTRAMUSCULAR | Status: AC
  Administered 2015-07-08: 10 mg via INTRAVENOUS

## 2015-07-08 MED ORDER — PROMETHAZINE HCL 25 MG PO TABS: 25.0000 mg | ORAL_TABLET | Freq: Four times a day (QID) | ORAL | Status: DC | PRN

## 2015-07-08 MED ORDER — PROCHLORPERAZINE EDISYLATE 5 MG/ML IJ SOLN
INTRAMUSCULAR | Status: AC
  Administered 2015-07-08: 10 mg via INTRAVENOUS
  Filled 2015-07-08: qty 2

## 2015-07-08 MED ORDER — ONDANSETRON HCL 4 MG/2ML IJ SOLN
4.0000 mg | Freq: Once | INTRAMUSCULAR | Status: AC
  Administered 2015-07-08: 4 mg via INTRAVENOUS
  Filled 2015-07-08: qty 2

## 2015-07-08 MED ORDER — OXYCODONE-ACETAMINOPHEN 5-325 MG PO TABS: 2.0000 | ORAL_TABLET | Freq: Four times a day (QID) | ORAL | Status: DC | PRN

## 2015-07-08 MED ORDER — HYDROMORPHONE HCL 1 MG/ML IJ SOLN
1.0000 mg | Freq: Once | INTRAMUSCULAR | Status: AC
  Administered 2015-07-08: 1 mg via INTRAVENOUS
  Filled 2015-07-08: qty 1

## 2015-07-08 NOTE — ED Provider Notes (Signed)
[facility] Emergency Department Provider Note        Time seen: ----------------------------------------- 8:19 PM on 07/08/2015 -----------------------------------------    I have reviewed the triage vital signs and the nursing notes.   HISTORY  Chief Complaint Abdominal Pain    HPI Jill Moon is a 25 y.o. female who presents ER for lower abdominal pain and frequent bowel movements. Patient reports she's been having lower back pain radiating into the front part of her abdomen. She spoke to her primary care doctor about IBS and started a new medication for weight loss. Patient is had decreased bowel movements but was concerned she may have had a kidney stone. Pain started as sharp and stabbing, associated diaphoresis or nausea vomiting. Nothing is made her symptoms better. Her symptoms are somewhat improved.   Past Medical History  Diagnosis Date  . Depression   . Polycystic disease, ovaries     Patient Active Problem List   Diagnosis Date Noted  . BMI 40.0-44.9, adult (HCC) 06/28/2015  . Morbid obesity due to excess calories (HCC) 06/28/2015  . Abortion, spontaneous 05/05/2015  . Clinical depression 05/05/2015  . Irregular menstrual cycle 05/05/2015    Past Surgical History  Procedure Laterality Date  . Tonsillectomy and adenoidectomy      Allergies Review of patient's allergies indicates no known allergies.  Social History Social History  Substance Use Topics  . Smoking status: Light Tobacco Smoker  . Smokeless tobacco: None  . Alcohol Use: Yes     Comment: occasional    Review of Systems Constitutional: Negative for fever. Eyes: Negative for visual changes. ENT: Negative for sore throat. Cardiovascular: Negative for chest pain. Respiratory: Negative for shortness of breath. Gastrointestinal: Positive for abdominal pain, vomiting, diarrhea Genitourinary: Negative for dysuria. Musculoskeletal: Negative for back  pain. Skin: Negative for rash. Neurological: Negative for headaches, focal weakness or numbness.  10-point ROS otherwise negative.  ____________________________________________   PHYSICAL EXAM:  VITAL SIGNS: ED Triage Vitals  Enc Vitals Group     BP 07/08/15 1851 135/82 mmHg     Pulse Rate 07/08/15 1851 74     Resp 07/08/15 1851 16     Temp 07/08/15 1851 97.5 F (36.4 C)     Temp Source 07/08/15 1851 Oral     SpO2 07/08/15 1851 100 %     Weight 07/08/15 1851 300 lb 2 oz (136.136 kg)     Height 07/08/15 1851  (1.778 m)     Head Cir --      Peak Flow --      Pain Score 07/08/15 1852 8     Pain Loc --      Pain Edu? --      Excl. in GC? --     Constitutional: Alert and oriented. Well appearing and in no distress. Eyes: Conjunctivae are normal. PERRL. Normal extraocular movements. ENT   Head: Normocephalic and atraumatic.   Nose: No congestion/rhinnorhea.   Mouth/Throat: Mucous membranes are moist.   Neck: No stridor. Cardiovascular: Normal rate, regular rhythm. No murmurs, rubs, or gallops. Respiratory: Normal respiratory effort without tachypnea nor retractions. Breath sounds are clear and equal bilaterally. No wheezes/rales/rhonchi. Gastrointestinal: Nonfocal tenderness, no rebound or guarding. Normal bowel sounds. Musculoskeletal: Nontender with normal range of motion in all extremities. No lower extremity tenderness nor edema. Neurologic:  Normal speech and language. No gross focal neurologic deficits are appreciated.  Skin:  Skin is warm, dry and intact. No rash noted. Psychiatric: Mood and affect  are normal. Speech and behavior are normal.  ____________________________________________  EKG: Interpreted by me. Normal sinus rhythm with a rate of 70 bpm, normal PR interval, normal QRS, normal QT interval. Normal axis area normal EKG  ____________________________________________  ED COURSE:  Pertinent labs & imaging results that were available  during my care of the patient were reviewed by me and considered in my medical decision making (see chart for details). Patient presents with vague abdominal pain which is likely IBS related. Family is requesting a workup for kidney stones. ____________________________________________    LABS (pertinent positives/negatives)  Labs Reviewed  COMPREHENSIVE METABOLIC PANEL - Abnormal; Notable for the following:    Glucose, Bld 121 (*)    Alkaline Phosphatase 21 (*)    Total Bilirubin 0.2 (*)    All other components within normal limits  CBC - Abnormal; Notable for the following:    MCV 77.1 (*)    MCH 25.4 (*)    RDW 15.6 (*)    All other components within normal limits  URINALYSIS COMPLETEWITH MICROSCOPIC ([facility] ONLY) - Abnormal; Notable for the following:    Color, Urine AMBER (*)    APPearance CLOUDY (*)    Ketones, ur TRACE (*)    Protein, ur 30 (*)    Leukocytes, UA 2+ (*)    Squamous Epithelial / LPF TOO NUMEROUS TO COUNT (*)    All other components within normal limits  URINE CULTURE  LIPASE, BLOOD  POC URINE PREG, ED  POCT PREGNANCY, URINE    RADIOLOGY Images were viewed by me  CT renal protocol IMPRESSION: No acute process within the abdomen or pelvis.  Cholelithiasis without CT evidence for acute cholecystitis. ____________________________________________  FINAL ASSESSMENT AND PLAN  Abdominal pain, IBS, Gallstones  Plan: Patient with labs and imaging as dictated above. Patient's initial urine sample was contaminated so we sent a culture of a fresh specimen. She'll be contacted if it is abnormal. Labs are grossly unremarkable, she did have gallstones but symptoms do not appear gallstone related. I will prescribe pain medicine and antiemetics for her to take at home. She is stable for outpatient follow-up.   Emily FilbertWilliams, [name], MD   Note: This dictation was prepared with Dragon dictation. Any transcriptional errors that result from this process are  unintentional   Emily FilbertJonathan E Almetta Liddicoat, MD 07/08/15 2234

## 2015-07-08 NOTE — ED Notes (Addendum)
Pt able to drink water and give a repeat urine sample at this time - clear directions with verbal back from pt about how to obtain a clean catch urine sample

## 2015-07-08 NOTE — Discharge Instructions (Signed)
Abdominal Pain, Adult Many things can cause abdominal pain. Usually, abdominal pain is not caused by a disease and will improve without treatment. It can often be observed and treated at home. Your health care provider will do a physical exam and possibly order blood tests and X-rays to help determine the seriousness of your pain. However, in many cases, more time must pass before a clear cause of the pain can be found. Before that point, your health care provider may not know if you need more testing or further treatment. HOME CARE INSTRUCTIONS Monitor your abdominal pain for any changes. The following actions may help to alleviate any discomfort you are experiencing:  Only take over-the-counter or prescription medicines as directed by your health care provider.  Do not take laxatives unless directed to do so by your health care provider.  Try a clear liquid diet (broth, tea, or water) as directed by your health care provider. Slowly move to a bland diet as tolerated. SEEK MEDICAL CARE IF:  You have unexplained abdominal pain.  You have abdominal pain associated with nausea or diarrhea.  You have pain when you urinate or have a bowel movement.  You experience abdominal pain that wakes you in the night.  You have abdominal pain that is worsened or improved by eating food.  You have abdominal pain that is worsened with eating fatty foods.  You have a fever. SEEK IMMEDIATE MEDICAL CARE IF:  Your pain does not go away within 2 hours.  You keep throwing up (vomiting).  Your pain is felt only in portions of the abdomen, such as the right side or the left lower portion of the abdomen.  You pass bloody or black tarry stools. MAKE SURE YOU:  Understand these instructions.  Will watch your condition.  Will get help right away if you are not doing well or get worse.   This information is not intended to replace advice given to you by your health care provider. Make sure you discuss  any questions you have with your health care provider.   Document Released: 11/29/2004 Document Revised: 11/10/2014 Document Reviewed: 10/29/2012 Elsevier Interactive Patient Education 2016 Elsevier Inc. Diet for Irritable Bowel Syndrome When you have irritable bowel syndrome (IBS), the foods you eat and your eating habits are very important. IBS may cause various symptoms, such as abdominal pain, constipation, or diarrhea. Choosing the right foods can help ease discomfort caused by these symptoms. Work with your health care provider and dietitian to find the best eating plan to help control your symptoms. WHAT GENERAL GUIDELINES DO I NEED TO FOLLOW?  Keep a food diary. This will help you identify foods that cause symptoms. Write down:  What you eat and when.  What symptoms you have.  When symptoms occur in relation to your meals.  Avoid foods that cause symptoms. Talk with your dietitian about other ways to get the same nutrients that are in these foods.  Eat more foods that contain fiber. Take a fiber supplement if directed by your dietitian.  Eat your meals slowly, in a relaxed setting.  Aim to eat 5-6 small meals per day. Do not skip meals.  Drink enough fluids to keep your urine clear or pale yellow.  Ask your health care provider if you should take an over-the-counter probiotic during flare-ups to help restore healthy gut bacteria.  If you have cramping or diarrhea, try making your meals low in fat and high in carbohydrates. Examples of carbohydrates are pasta, rice, whole  grain breads and cereals, fruits, and vegetables.  If dairy products cause your symptoms to flare up, try eating less of them. You might be able to handle yogurt better than other dairy products because it contains bacteria that help with digestion. WHAT FOODS ARE NOT RECOMMENDED? The following are some foods and drinks that may worsen your symptoms:  Fatty foods, such as [place] fries.  Milk products,  such as cheese or ice cream.  Chocolate.  Alcohol.  Products with caffeine, such as coffee.  Carbonated drinks, such as soda. The items listed above may not be a complete list of foods and beverages to avoid. Contact your dietitian for more information. WHAT FOODS ARE GOOD SOURCES OF FIBER? Your health care provider or dietitian may recommend that you eat more foods that contain fiber. Fiber can help reduce constipation and other IBS symptoms. Add foods with fiber to your diet a little at a time so that your body can get used to them. Too much fiber at once might cause gas and swelling of your abdomen. The following are some foods that are good sources of fiber:  Apples.  Peaches.  Pears.  Berries.  Figs.  Broccoli (raw).  Cabbage.  Carrots.  Raw peas.  Kidney beans.  Lima beans.  Whole grain bread.  Whole grain cereal. FOR MORE INFORMATION  International Foundation for Functional Gastrointestinal Disorders: www.iffgd.[employer] of Diabetes and Digestive and Kidney Diseases: http://norris-lawson.com/.aspx   This information is not intended to replace advice given to you by your health care provider. Make sure you discuss any questions you have with your health care provider.   Document Released: 05/12/2003 Document Revised: 03/12/2014 Document Reviewed: 05/22/2013 Elsevier Interactive Patient Education 2016 [employer]. Cholelithiasis Cholelithiasis (also called gallstones) is a form of gallbladder disease in which gallstones form in your gallbladder. The gallbladder is an organ that stores bile made in the liver, which helps digest fats. Gallstones begin as small crystals and slowly grow into stones. Gallstone pain occurs when the gallbladder spasms and a gallstone is blocking the duct. Pain can also occur when a stone passes out of the duct.  RISK FACTORS  Being female.   Having multiple  pregnancies. Health care providers sometimes advise removing diseased gallbladders before future pregnancies.   Being obese.  Eating a diet heavy in fried foods and fat.   Being older than 60 years and increasing age.   Prolonged use of medicines containing female hormones.   Having diabetes mellitus.   Rapidly losing weight.   Having a family history of gallstones (heredity).  SYMPTOMS  Nausea.   Vomiting.  Abdominal pain.   Yellowing of the skin (jaundice).   Sudden pain. It may persist from several minutes to several hours.  Fever.   Tenderness to the touch. In some cases, when gallstones do not move into the bile duct, people have no pain or symptoms. These are called "silent" gallstones.  TREATMENT Silent gallstones do not need treatment. In severe cases, emergency surgery may be required. Options for treatment include:  Surgery to remove the gallbladder. This is the most common treatment.  Medicines. These do not always work and may take 6-12 months or more to work.  Shock wave treatment (extracorporeal biliary lithotripsy). In this treatment an ultrasound machine sends shock waves to the gallbladder to break gallstones into smaller pieces that can pass into the intestines or be dissolved by medicine. HOME CARE INSTRUCTIONS   Only take over-the-counter or prescription medicines for pain, discomfort,  or fever as directed by your health care provider.   Follow a low-fat diet until seen again by your health care provider. Fat causes the gallbladder to contract, which can result in pain.   Follow up with your health care provider as directed. Attacks are almost always recurrent and surgery is usually required for permanent treatment.  SEEK IMMEDIATE MEDICAL CARE IF:   Your pain increases and is not controlled by medicines.   You have a fever or persistent symptoms for more than 2-3 days.   You have a fever and your symptoms suddenly get worse.    You have persistent nausea and vomiting.  MAKE SURE YOU:   Understand these instructions.  Will watch your condition.  Will get help right away if you are not doing well or get worse.   This information is not intended to replace advice given to you by your health care provider. Make sure you discuss any questions you have with your health care provider.   Document Released: 02/15/2005 Document Revised: 10/22/2012 Document Reviewed: 08/13/2012 Elsevier Interactive Patient Education [employer]

## 2015-07-08 NOTE — ED Notes (Signed)
Pt arrives to ER via POV c/o bilateral upper quadrant pain that started abruptly at 1630. Pt nausea and vomiting. PT arrives sweaty, diaphoretic. Pt alert and oriented X4, active, cooperative, pt in NAD. RR even and unlabored, color WNL.

## 2015-07-08 NOTE — ED Notes (Signed)
Pt is actively vomiting - MD notified - order received for Compazine

## 2015-07-08 NOTE — ED Notes (Signed)
Pt c/o lower abd pain and frequent BM's - she also states that she is having lower back pain radiating to the front under her breast - she has spoken to her MD about IBS - she has started a new medication for weight lose and it has decreased the BM's that she was having 6 movements a day - family hx of kidney stones - pt has had nausea and vomiting relieved by Zofran

## 2015-07-10 LAB — URINE CULTURE: Special Requests: NORMAL

## 2015-07-26 ENCOUNTER — Encounter: Payer: Self-pay | Admitting: Physician Assistant

## 2015-07-26 ENCOUNTER — Ambulatory Visit (INDEPENDENT_AMBULATORY_CARE_PROVIDER_SITE_OTHER): Payer: BLUE CROSS/BLUE SHIELD | Admitting: Physician Assistant

## 2015-07-26 VITALS — BP 120/82 | HR 71 | Temp 97.6°F | Resp 16 | Wt 293.6 lb

## 2015-07-26 DIAGNOSIS — Z6841 Body Mass Index (BMI) 40.0 and over, adult: Secondary | ICD-10-CM | POA: Diagnosis not present

## 2015-07-26 DIAGNOSIS — Z713 Dietary counseling and surveillance: Secondary | ICD-10-CM | POA: Diagnosis not present

## 2015-07-26 MED ORDER — PHENTERMINE HCL 37.5 MG PO CAPS: 37.5000 mg | ORAL_CAPSULE | ORAL | Status: DC

## 2015-07-26 NOTE — Patient Instructions (Signed)

## 2015-07-26 NOTE — Progress Notes (Signed)
Patient: Jill Moon Female    DOB: 1990/07/13   24 y.o.   MRN: [id number] Visit Date: 07/26/2015  Today's Provider: Margaretann LovelessJennifer M Guled Gahan, PA-C   Chief Complaint  Patient presents with  . Follow-up    weight   Subjective:    HPI  Patient is here today to follow-up on Obesity. She is on Phentermine 37.5 mg. She reports that she was keeping the food diary and counting calories. She reports that she had a gallbladder attack and went to the ED at [facility] on 07/08/15. She has totally changed her diet to avoid future attacks and has not been counting calories as much as she had been because of diet changes. She also cut back on how frequently she was exercising because she is having to work more to build up for time that is going to be needed off following her possible surgery. She reports she has an appointment scheduled with the general surgery this Thursday at 8:40 to consult about possible cholecystectomy. She thinks she is seeing Dr. Juliann PulseLundquist.  Home Exercise  07/26/2015  Current Exercise Habits Home exercise routine  Type of exercise walking  Time (Minutes) 25  Frequency (Times/Week) 5  Weekly Exercise (Minutes/Week) 125  Intensity Mild  Exercise limited by: None identified   Wt Readings from Last 3 Encounters:  07/26/15 293 lb 9.6 oz (133.176 kg)  07/08/15 300 lb 2 oz (136.136 kg)  06/28/15 301 lb (136.533 kg)       No Known Allergies Previous Medications   ESCITALOPRAM (LEXAPRO) 10 MG TABLET    Take 1 tablet (10 mg total) by mouth at bedtime.   METFORMIN (GLUCOPHAGE) 500 MG TABLET    Take 1 tablet (500 mg total) by mouth daily with breakfast.   OXYCODONE-ACETAMINOPHEN (PERCOCET) 5-325 MG TABLET    Take 2 tablets by mouth every 6 (six) hours as needed for moderate pain or severe pain.   PHENTERMINE 37.5 MG CAPSULE    Take 1 capsule (37.5 mg total) by mouth every morning.   PROMETHAZINE (PHENERGAN) 25 MG TABLET    Take 1 tablet (25 mg total) by mouth every 6 (six)  hours as needed for nausea or vomiting.   RECLIPSEN 0.15-30 MG-MCG TABLET        Review of Systems  Constitutional: Negative.   Respiratory: Negative.   Cardiovascular: Negative for chest pain, palpitations and leg swelling.  Gastrointestinal: Positive for abdominal pain (Just in the upper right side after certain foods) and diarrhea. Negative for nausea, vomiting and blood in stool.  Psychiatric/Behavioral: Negative.     Social History  Substance Use Topics  . Smoking status: Light Tobacco Smoker  . Smokeless tobacco: Not on file  . Alcohol Use: Yes     Comment: occasional   Objective:   BP 120/82 mmHg  Pulse 71  Temp(Src) 97.6 F (36.4 C) (Oral)  Resp 16  Wt 293 lb 9.6 oz (133.176 kg)  LMP 07/21/2015  Physical Exam  Constitutional: She appears well-developed and well-nourished. No distress.  Neck: Normal range of motion. Neck supple.  Cardiovascular: Normal rate, regular rhythm and normal heart sounds.  Exam reveals no gallop and no friction rub.   No murmur heard. Pulmonary/Chest: Effort normal and breath sounds normal. No respiratory distress. She has no wheezes. She has no rales.  Skin: She is not diaphoretic.  Psychiatric: She has a normal mood and affect. Her behavior is normal. Judgment and thought content normal.  Vitals reviewed.  Assessment & Plan:     1. Encounter for weight loss counseling Discussed foods to avoid for gallbladder issues and foods that should be ok to continue to eat. Discussed trying to find small time for physical activity, that she doesn't have to exercise for 30-40 minutes at a time but to take small 5-10 minute breaks at work to walk around or do wall push-ups or lunges/squats. She agrees. Will continue phentermine for appetite suppression. She is to call if needed. I will see her back in 4 weeks for weight check. - phentermine 37.5 MG capsule; Take 1 capsule (37.5 mg total) by mouth every morning.  Dispense: 30 capsule; Refill:  0  2. BMI 40.0-44.9, adult [facility]) See above medical treatment plan.  3. Morbid obesity due to excess calories [facility]) See above medical treatment plan. - phentermine 37.5 MG capsule; Take 1 capsule (37.5 mg total) by mouth every morning.  Dispense: 30 capsule; Refill: 0       Margaretann Loveless, PA-C  [facility]

## 2015-07-28 ENCOUNTER — Other Ambulatory Visit
Admission: RE | Admit: 2015-07-28 | Discharge: 2015-07-28 | Disposition: A | Discharge status: Home / Self Care | Payer: BLUE CROSS/BLUE SHIELD | Source: Ambulatory Visit | Attending: Surgery | Admitting: Surgery

## 2015-07-28 ENCOUNTER — Ambulatory Visit (INDEPENDENT_AMBULATORY_CARE_PROVIDER_SITE_OTHER): Payer: BLUE CROSS/BLUE SHIELD | Admitting: Surgery

## 2015-07-28 ENCOUNTER — Encounter: Payer: Self-pay | Admitting: Surgery

## 2015-07-28 VITALS — BP 118/75 | HR 82 | Temp 97.9°F | Ht 70.0 in | Wt 295.0 lb

## 2015-07-28 DIAGNOSIS — K8020 Calculus of gallbladder without cholecystitis without obstruction: Secondary | ICD-10-CM

## 2015-07-28 DIAGNOSIS — G8929 Other chronic pain: Secondary | ICD-10-CM | POA: Diagnosis not present

## 2015-07-28 DIAGNOSIS — R1031 Right lower quadrant pain: Secondary | ICD-10-CM | POA: Insufficient documentation

## 2015-07-28 LAB — URINALYSIS COMPLETE WITH MICROSCOPIC (ARMC ONLY)
BILIRUBIN URINE: NEGATIVE
GLUCOSE, UA: NEGATIVE mg/dL
HGB URINE DIPSTICK: NEGATIVE
Ketones, ur: NEGATIVE mg/dL
NITRITE: NEGATIVE
PH: 5 (ref 5.0–8.0)
Protein, ur: NEGATIVE mg/dL
SPECIFIC GRAVITY, URINE: 1.016 (ref 1.005–1.030)

## 2015-07-28 MED ORDER — DEXTROSE 5 % IV SOLN: 2.0000 g | Freq: Once | INTRAVENOUS | Status: DC

## 2015-07-28 NOTE — Patient Instructions (Signed)
Please go to the lab to drop off a urine specimen. We will call you with the results. Please call our office if you have questions or concerns.

## 2015-07-29 ENCOUNTER — Telehealth: Payer: Self-pay | Admitting: Surgery

## 2015-07-29 ENCOUNTER — Encounter: Payer: Self-pay | Admitting: Surgery

## 2015-07-29 DIAGNOSIS — K8020 Calculus of gallbladder without cholecystitis without obstruction: Secondary | ICD-10-CM | POA: Insufficient documentation

## 2015-07-29 LAB — URINE CULTURE

## 2015-07-29 NOTE — Progress Notes (Signed)
Subjective:     Patient ID: Jill Moon, female   DOB: 14-Feb-1991, 25 y.o.   MRN: [id number]  HPI  25 year old female who comes in today complaining of chronic right upper quadrant pain with intermittent nausea. Patient states that she's had some episodes like this in the past. Patient was seen in the emergency department on May 5 for this where she had been having right upper quadrant pain that moved around her right flank and had a stabbing constant pain which also made her diaphoretic. Patient states that the pain was an 8 out of 10 and that she did have some soreness underneath the shoulder blade as well. Patient states she has nausea and vomiting as well. Patient also states she had increased flatulence and bloating and some diarrhea-like stools along with this. Patient had labs that were within normal limits for her white blood cell count as well as her LFTs however she did have a UA at that time that was positive for cloudiness proteins leukocyte esterase and bacteria in it appears that she had a UTI at the time. Patient denies any dysuria urgency frequency painful urination or any other symptoms from that standpoint. Patient states that she was never contacted with these results and had not had any antibiotics treat this. Patient did have a CT scan in the hospital the did show some cholelithiasis but no evidence of acute cholecystitis at the time. Patient denies any fever or chills any constipation or any dysuria.  Her aunt and mother had to have their gallbladders removed before as well.   Past Medical History  Diagnosis Date  . Depression   . Polycystic disease, ovaries   Obesity  Past Surgical History  Procedure Laterality Date  . Tonsillectomy and adenoidectomy     Family History  Problem Relation Age of Onset  . Diabetes Mother     Type 2  . Hypertension Mother   . Hyperlipidemia Mother   . Cancer Paternal Aunt     Ovarian  . Cancer Paternal Grandfather     Liver  .  Hypertension Paternal Grandfather    Social History   Social History  . Marital Status: Single    Spouse Name: N/A  . Number of Children: N/A  . Years of Education: N/A   Social History Main Topics  . Smoking status: Light Tobacco Smoker  . Smokeless tobacco: None  . Alcohol Use: Yes     Comment: occasional  . Drug Use: No  . Sexual Activity: Not Asked   Other Topics Concern  . None   Social History Narrative    Current outpatient prescriptions:  .  escitalopram (LEXAPRO) 10 MG tablet, Take 1 tablet (10 mg total) by mouth at bedtime., Disp: 30 tablet, Rfl: 6 .  metFORMIN (GLUCOPHAGE) 500 MG tablet, Take 1 tablet (500 mg total) by mouth daily with breakfast., Disp: 30 tablet, Rfl: 6 .  oxyCODONE-acetaminophen (PERCOCET) 5-325 MG tablet, Take 2 tablets by mouth every 6 (six) hours as needed for moderate pain or severe pain., Disp: 30 tablet, Rfl: 0 .  phentermine 37.5 MG capsule, Take 1 capsule (37.5 mg total) by mouth every morning., Disp: 30 capsule, Rfl: 0 .  promethazine (PHENERGAN) 25 MG tablet, Take 1 tablet (25 mg total) by mouth every 6 (six) hours as needed for nausea or vomiting., Disp: 20 tablet, Rfl: 0 .  RECLIPSEN 0.15-30 MG-MCG tablet, , Disp: , Rfl: 8  Current facility-administered medications:  .  cefOXitin (MEFOXIN) 2 g in  dextrose 5 % 50 mL IVPB, 2 g, Intravenous, Once, Gladis Riffleatherine L Arynn Armand, MD No Known Allergies     Review of Systems  Constitutional: Positive for diaphoresis, appetite change and fatigue. Negative for fever, chills, activity change and unexpected weight change.  HENT: Positive for sore throat. Negative for congestion.   Respiratory: Negative for chest tightness, shortness of breath, wheezing and stridor.   Cardiovascular: Negative for chest pain, palpitations and leg swelling.  Gastrointestinal: Positive for nausea, vomiting, abdominal pain and diarrhea. Negative for constipation and abdominal distention.  Genitourinary: Negative for  dysuria, urgency, frequency, hematuria and flank pain.  Musculoskeletal: Negative for back pain.  Skin: Negative for color change, pallor, rash and wound.  Neurological: Negative for dizziness and weakness.  Hematological: Negative for adenopathy. Does not bruise/bleed easily.  Psychiatric/Behavioral: Negative for agitation. The patient is not nervous/anxious.   All other systems reviewed and are negative.      Filed Vitals:   07/28/15 0850  BP: 118/75  Pulse: 82  Temp: 97.9 F (36.6 C)    Objective:   Physical Exam  Constitutional: She is oriented to person, place, and time. She appears well-developed and well-nourished. No distress.  HENT:  Head: Normocephalic and atraumatic.  Right Ear: External ear normal.  Left Ear: External ear normal.  Nose: Nose normal.  Mouth/Throat: Oropharynx is clear and moist. No oropharyngeal exudate.  Eyes: Conjunctivae and EOM are normal. Pupils are equal, round, and reactive to light. No scleral icterus.  Neck: Normal range of motion. Neck supple. No tracheal deviation present.  Cardiovascular: Normal rate, regular rhythm, normal heart sounds and intact distal pulses.  Exam reveals no gallop and no friction rub.   No murmur heard. Pulmonary/Chest: Effort normal and breath sounds normal. No respiratory distress. She has no wheezes. She has no rales.  Abdominal: Soft. Bowel sounds are normal. She exhibits no distension and no mass. There is tenderness. There is no rebound and no guarding.  RUQ tenderness, negative murphy's sign, no hepatosplenomegaly  Musculoskeletal: Normal range of motion. She exhibits no edema or tenderness.  Neurological: She is alert and oriented to person, place, and time. No cranial nerve deficit.  Skin: Skin is warm and dry. No rash noted. No erythema. No pallor.  Psychiatric: She has a normal mood and affect. Her behavior is normal. Judgment and thought content normal.  Vitals reviewed.      Assessment:     25 yr  old female with symptomatic cholelithiasis     Plan:     The patient's symptoms and her gallstones which show symptomatic cholelithiasis. Given the fact that she had a positive UA the last time and had continued symptoms we'll get a UA and and treat her with antibiotics if this is positive.  The risks, benefits, complications, treatment options, and expected outcomes were discussed with the patient. The possibilities of bleeding, recurrent infection, finding a normal gallbladder, perforation of viscus organs, damage to surrounding structures, bile leak, abscess formation, needing a drain placed, the need for additional procedures, reaction to medication, pulmonary aspiration,  failure to diagnose a condition, the possible need to convert to an open procedure, and creating a complication requiring transfusion or operation were discussed with the patient. The patient and/or family concurred with the proposed plan, giving informed consent. We will schedule for Lap chole in the third week of June.

## 2015-07-29 NOTE — Telephone Encounter (Signed)
Pt advised of pre op date/time and sx date. Sx: 08/23/15 with Dr Dow AdolphLoflin--Laparoscopic cholecystectomy.  Pre op: 08/16/15 @ 9:00am--Office pre op.  Patient made aware to call [phone], between 1-3:00pm the day before surgery, to find out what time to arrive.

## 2015-08-02 NOTE — Telephone Encounter (Signed)
Patient has called and would like to know of the results of her Urinalysis and Urine Culture on 07/28/15. Results are available in the chart. Please call patient back at number verified in chart.

## 2015-08-02 NOTE — Telephone Encounter (Signed)
Returned phone call to patient at this time. No answer. Left voicemail for return phone call.  Patient is positive for UTI. Will need antibiotic sent in. Will verify allergies with patient and then send in antibiotic per Dr. Orvis BrillLoflin.

## 2015-08-04 MED ORDER — SULFAMETHOXAZOLE-TRIMETHOPRIM 800-160 MG PO TABS: 1.0000 | ORAL_TABLET | Freq: Two times a day (BID) | ORAL | Status: DC

## 2015-08-04 NOTE — Telephone Encounter (Signed)
Patient returned your phone call. She is going to attempt to call you back between 1:30-1:45 or you can call her too.

## 2015-08-04 NOTE — Telephone Encounter (Signed)
Patient has called back and stated that she has no known medication allergies. To please send prescription to [facility] on [address] [place]. Please call and leave her a voicemail once this is completed. Patient is in class at this time.

## 2015-08-04 NOTE — Telephone Encounter (Signed)
Medication sent to Pharmacy.  Called patient and let her know that this has been completed. No answer. Left voicemail with this information.

## 2015-08-09 ENCOUNTER — Encounter: Payer: Self-pay | Admitting: Emergency Medicine

## 2015-08-09 ENCOUNTER — Observation Stay: Payer: BLUE CROSS/BLUE SHIELD

## 2015-08-09 ENCOUNTER — Telehealth: Payer: Self-pay | Admitting: Surgery

## 2015-08-09 ENCOUNTER — Emergency Department: Payer: BLUE CROSS/BLUE SHIELD

## 2015-08-09 ENCOUNTER — Observation Stay
Admission: EM | Admit: 2015-08-09 | Discharge: 2015-08-13 | Disposition: A | Discharge status: Home / Self Care | Payer: BLUE CROSS/BLUE SHIELD | Attending: Surgery | Admitting: Surgery

## 2015-08-09 ENCOUNTER — Observation Stay: Payer: BLUE CROSS/BLUE SHIELD | Admitting: Anesthesiology

## 2015-08-09 ENCOUNTER — Encounter
Admission: EM | Disposition: A | Discharge status: Home / Self Care | Payer: Self-pay | Source: Home / Self Care | Attending: Emergency Medicine

## 2015-08-09 DIAGNOSIS — R109 Unspecified abdominal pain: Secondary | ICD-10-CM

## 2015-08-09 DIAGNOSIS — F329 Major depressive disorder, single episode, unspecified: Secondary | ICD-10-CM | POA: Insufficient documentation

## 2015-08-09 DIAGNOSIS — Z8249 Family history of ischemic heart disease and other diseases of the circulatory system: Secondary | ICD-10-CM | POA: Insufficient documentation

## 2015-08-09 DIAGNOSIS — K8062 Calculus of gallbladder and bile duct with acute cholecystitis without obstruction: Secondary | ICD-10-CM | POA: Insufficient documentation

## 2015-08-09 DIAGNOSIS — Z7984 Long term (current) use of oral hypoglycemic drugs: Secondary | ICD-10-CM | POA: Insufficient documentation

## 2015-08-09 DIAGNOSIS — K810 Acute cholecystitis: Secondary | ICD-10-CM | POA: Diagnosis not present

## 2015-08-09 DIAGNOSIS — Z833 Family history of diabetes mellitus: Secondary | ICD-10-CM | POA: Diagnosis not present

## 2015-08-09 DIAGNOSIS — Z8041 Family history of malignant neoplasm of ovary: Secondary | ICD-10-CM | POA: Insufficient documentation

## 2015-08-09 DIAGNOSIS — K838 Other specified diseases of biliary tract: Secondary | ICD-10-CM | POA: Insufficient documentation

## 2015-08-09 DIAGNOSIS — R1011 Right upper quadrant pain: Secondary | ICD-10-CM | POA: Diagnosis not present

## 2015-08-09 DIAGNOSIS — K8050 Calculus of bile duct without cholangitis or cholecystitis without obstruction: Secondary | ICD-10-CM | POA: Diagnosis present

## 2015-08-09 DIAGNOSIS — K8010 Calculus of gallbladder with chronic cholecystitis without obstruction: Secondary | ICD-10-CM | POA: Diagnosis not present

## 2015-08-09 DIAGNOSIS — K8080 Other cholelithiasis without obstruction: Secondary | ICD-10-CM | POA: Diagnosis not present

## 2015-08-09 DIAGNOSIS — Z800 Family history of malignant neoplasm of digestive organs: Secondary | ICD-10-CM | POA: Diagnosis not present

## 2015-08-09 DIAGNOSIS — Z6841 Body Mass Index (BMI) 40.0 and over, adult: Secondary | ICD-10-CM | POA: Insufficient documentation

## 2015-08-09 DIAGNOSIS — Z79899 Other long term (current) drug therapy: Secondary | ICD-10-CM | POA: Insufficient documentation

## 2015-08-09 DIAGNOSIS — K8066 Calculus of gallbladder and bile duct with acute and chronic cholecystitis without obstruction: Secondary | ICD-10-CM | POA: Diagnosis not present

## 2015-08-09 DIAGNOSIS — E282 Polycystic ovarian syndrome: Secondary | ICD-10-CM | POA: Diagnosis not present

## 2015-08-09 DIAGNOSIS — N23 Unspecified renal colic: Secondary | ICD-10-CM | POA: Diagnosis not present

## 2015-08-09 DIAGNOSIS — K8020 Calculus of gallbladder without cholecystitis without obstruction: Secondary | ICD-10-CM

## 2015-08-09 DIAGNOSIS — F17200 Nicotine dependence, unspecified, uncomplicated: Secondary | ICD-10-CM | POA: Diagnosis not present

## 2015-08-09 DIAGNOSIS — K839 Disease of biliary tract, unspecified: Secondary | ICD-10-CM | POA: Insufficient documentation

## 2015-08-09 HISTORY — PX: CHOLECYSTECTOMY: SHX55

## 2015-08-09 LAB — COMPREHENSIVE METABOLIC PANEL
ALBUMIN: 3.9 g/dL (ref 3.5–5.0)
ALK PHOS: 37 U/L — AB (ref 38–126)
ALT: 53 U/L (ref 14–54)
AST: 78 U/L — AB (ref 15–41)
Anion gap: 10 (ref 5–15)
BILIRUBIN TOTAL: 0.9 mg/dL (ref 0.3–1.2)
BUN: 8 mg/dL (ref 6–20)
CALCIUM: 9.4 mg/dL (ref 8.9–10.3)
CO2: 22 mmol/L (ref 22–32)
Chloride: 103 mmol/L (ref 101–111)
Creatinine, Ser: 0.62 mg/dL (ref 0.44–1.00)
GFR calc Af Amer: >60 mL/min (ref >60)
GFR calc non Af Amer: >60 mL/min (ref >60)
GLUCOSE: 157 mg/dL — AB (ref 65–99)
Potassium: 4.2 mmol/L (ref 3.5–5.1)
Sodium: 135 mmol/L (ref 135–145)
TOTAL PROTEIN: 8.3 g/dL — AB (ref 6.5–8.1)

## 2015-08-09 LAB — CBC WITH DIFFERENTIAL/PLATELET
BASOS ABS: 0.1 10*3/uL (ref 0–0.1)
Eosinophils Absolute: 0.1 10*3/uL (ref 0–0.7)
HCT: 39 % (ref 35.0–47.0)
Hemoglobin: 12.8 g/dL (ref 12.0–16.0)
Lymphocytes Relative: 7 %
Lymphs Abs: 0.9 10*3/uL — ABNORMAL LOW (ref 1.0–3.6)
MCH: 25.5 pg — ABNORMAL LOW (ref 26.0–34.0)
MCHC: 32.8 g/dL (ref 32.0–36.0)
MCV: 77.7 fL — AB (ref 80.0–100.0)
MONO ABS: 0.8 10*3/uL (ref 0.2–0.9)
Monocytes Relative: 6 %
Neutro Abs: 11.7 10*3/uL — ABNORMAL HIGH (ref 1.4–6.5)
Neutrophils Relative %: 85 %
PLATELETS: 316 10*3/uL (ref 150–440)
RBC: 5.01 MIL/uL (ref 3.80–5.20)
RDW: 15.9 % — AB (ref 11.5–14.5)
WBC: 13.5 10*3/uL — ABNORMAL HIGH (ref 3.6–11.0)

## 2015-08-09 LAB — SURGICAL PCR SCREEN
MRSA, PCR: NEGATIVE
STAPHYLOCOCCUS AUREUS: NEGATIVE

## 2015-08-09 LAB — LIPASE, BLOOD: Lipase: 20 U/L (ref 11–51)

## 2015-08-09 SURGERY — LAPAROSCOPIC CHOLECYSTECTOMY WITH INTRAOPERATIVE CHOLANGIOGRAM
Anesthesia: General | Site: Abdomen | Wound class: Clean Contaminated

## 2015-08-09 MED ORDER — ROCURONIUM BROMIDE 100 MG/10ML IV SOLN
INTRAVENOUS | Status: DC | PRN
  Administered 2015-08-09: 10 mg via INTRAVENOUS
  Administered 2015-08-09: 30 mg via INTRAVENOUS

## 2015-08-09 MED ORDER — SUCCINYLCHOLINE CHLORIDE 20 MG/ML IJ SOLN
INTRAMUSCULAR | Status: DC | PRN
  Administered 2015-08-09: 100 mg via INTRAVENOUS

## 2015-08-09 MED ORDER — LIDOCAINE HCL (CARDIAC) 20 MG/ML IV SOLN
INTRAVENOUS | Status: DC | PRN
  Administered 2015-08-09: 30 mg via INTRAVENOUS

## 2015-08-09 MED ORDER — ONDANSETRON HCL 4 MG PO TABS: 4.0000 mg | ORAL_TABLET | Freq: Four times a day (QID) | ORAL | Status: DC | PRN

## 2015-08-09 MED ORDER — PIPERACILLIN-TAZOBACTAM 4.5 G IVPB
4.5000 g | Freq: Three times a day (TID) | INTRAVENOUS | Status: DC
  Administered 2015-08-09: 4.5 g via INTRAVENOUS
  Filled 2015-08-09 (×3): qty 100

## 2015-08-09 MED ORDER — HYDROCODONE-ACETAMINOPHEN 5-325 MG PO TABS
1.0000 | ORAL_TABLET | ORAL | Status: DC | PRN
  Administered 2015-08-10 – 2015-08-13 (×11): 1 via ORAL
  Filled 2015-08-09 (×2): qty 1
  Filled 2015-08-09: qty 2
  Filled 2015-08-09 (×5): qty 1
  Filled 2015-08-09: qty 2
  Filled 2015-08-09 (×3): qty 1

## 2015-08-09 MED ORDER — SODIUM CHLORIDE 0.9 % IR SOLN
Status: DC | PRN
  Administered 2015-08-09: 1000 mL

## 2015-08-09 MED ORDER — FENTANYL CITRATE (PF) 100 MCG/2ML IJ SOLN
INTRAMUSCULAR | Status: DC | PRN
  Administered 2015-08-09 (×7): 50 ug via INTRAVENOUS

## 2015-08-09 MED ORDER — ENOXAPARIN SODIUM 40 MG/0.4ML ~~LOC~~ SOLN
40.0000 mg | SUBCUTANEOUS | Status: DC
  Administered 2015-08-09 – 2015-08-12 (×4): 40 mg via SUBCUTANEOUS
  Filled 2015-08-09 (×4): qty 0.4

## 2015-08-09 MED ORDER — ONDANSETRON HCL 4 MG/2ML IJ SOLN
4.0000 mg | Freq: Once | INTRAMUSCULAR | Status: AC
  Administered 2015-08-09: 4 mg via INTRAVENOUS
  Filled 2015-08-09: qty 2

## 2015-08-09 MED ORDER — PROPOFOL 10 MG/ML IV BOLUS
INTRAVENOUS | Status: DC | PRN
  Administered 2015-08-09: 170 mg via INTRAVENOUS

## 2015-08-09 MED ORDER — HEPARIN SODIUM (PORCINE) 5000 UNIT/ML IJ SOLN
INTRAMUSCULAR | Status: AC
  Filled 2015-08-09: qty 1

## 2015-08-09 MED ORDER — LACTATED RINGERS IV SOLN
INTRAVENOUS | Status: DC | PRN
  Administered 2015-08-09 (×3): via INTRAVENOUS

## 2015-08-09 MED ORDER — HYDROMORPHONE HCL 1 MG/ML IJ SOLN
1.0000 mg | INTRAMUSCULAR | Status: DC | PRN
  Administered 2015-08-10 – 2015-08-11 (×5): 1 mg via INTRAVENOUS
  Filled 2015-08-09 (×5): qty 1

## 2015-08-09 MED ORDER — ONDANSETRON HCL 4 MG/2ML IJ SOLN: 4.0000 mg | Freq: Once | INTRAMUSCULAR | Status: DC | PRN

## 2015-08-09 MED ORDER — DEXAMETHASONE SODIUM PHOSPHATE 10 MG/ML IJ SOLN
INTRAMUSCULAR | Status: DC | PRN
  Administered 2015-08-09: 4 mg via INTRAVENOUS

## 2015-08-09 MED ORDER — FENTANYL CITRATE (PF) 100 MCG/2ML IJ SOLN: 25.0000 ug | INTRAMUSCULAR | Status: DC | PRN

## 2015-08-09 MED ORDER — SUGAMMADEX SODIUM 500 MG/5ML IV SOLN
INTRAVENOUS | Status: DC | PRN
  Administered 2015-08-09: 267.6 mg via INTRAVENOUS

## 2015-08-09 MED ORDER — PANTOPRAZOLE SODIUM 40 MG PO PACK
40.0000 mg | PACK | Freq: Every day | ORAL | Status: DC
  Filled 2015-08-09: qty 20

## 2015-08-09 MED ORDER — BUPIVACAINE HCL (PF) 0.25 % IJ SOLN
INTRAMUSCULAR | Status: AC
  Filled 2015-08-09: qty 30

## 2015-08-09 MED ORDER — MORPHINE SULFATE (PF) 4 MG/ML IV SOLN
4.0000 mg | Freq: Once | INTRAVENOUS | Status: AC
  Administered 2015-08-09: 4 mg via INTRAVENOUS
  Filled 2015-08-09: qty 1

## 2015-08-09 MED ORDER — ACETAMINOPHEN 325 MG PO TABS: 650.0000 mg | ORAL_TABLET | Freq: Four times a day (QID) | ORAL | Status: DC | PRN

## 2015-08-09 MED ORDER — SODIUM CHLORIDE 0.9 % IJ SOLN
INTRAMUSCULAR | Status: AC
  Filled 2015-08-09: qty 50

## 2015-08-09 MED ORDER — SODIUM CHLORIDE 0.9 % IV BOLUS (SEPSIS)
500.0000 mL | Freq: Once | INTRAVENOUS | Status: AC
  Administered 2015-08-09: 500 mL via INTRAVENOUS

## 2015-08-09 MED ORDER — ONDANSETRON HCL 4 MG/2ML IJ SOLN: 4.0000 mg | Freq: Four times a day (QID) | INTRAMUSCULAR | Status: DC | PRN

## 2015-08-09 MED ORDER — MORPHINE SULFATE (PF) 2 MG/ML IV SOLN
2.0000 mg | INTRAVENOUS | Status: DC | PRN
  Administered 2015-08-09: 2 mg via INTRAVENOUS
  Filled 2015-08-09: qty 1

## 2015-08-09 MED ORDER — GLYCOPYRROLATE 0.2 MG/ML IJ SOLN
INTRAMUSCULAR | Status: DC | PRN
Start: 2015-08-09 — End: 2015-08-09
  Administered 2015-08-09: 0.2 mg via INTRAVENOUS

## 2015-08-09 MED ORDER — ACETAMINOPHEN 650 MG RE SUPP: 650.0000 mg | Freq: Four times a day (QID) | RECTAL | Status: DC | PRN

## 2015-08-09 MED ORDER — SODIUM CHLORIDE 0.9 % IV SOLN
INTRAVENOUS | Status: DC
  Administered 2015-08-09 – 2015-08-12 (×4): via INTRAVENOUS

## 2015-08-09 MED ORDER — BUPIVACAINE HCL (PF) 0.25 % IJ SOLN
INTRAMUSCULAR | Status: DC | PRN
  Administered 2015-08-09: 30 mL

## 2015-08-09 MED ORDER — MIDAZOLAM HCL 2 MG/2ML IJ SOLN
INTRAMUSCULAR | Status: DC | PRN
  Administered 2015-08-09: 2 mg via INTRAVENOUS

## 2015-08-09 MED ORDER — ONDANSETRON HCL 4 MG/2ML IJ SOLN
INTRAMUSCULAR | Status: DC | PRN
  Administered 2015-08-09: 4 mg via INTRAVENOUS

## 2015-08-09 MED ORDER — MORPHINE SULFATE (PF) 2 MG/ML IV SOLN
2.0000 mg | Freq: Once | INTRAVENOUS | Status: AC
  Administered 2015-08-09: 2 mg via INTRAVENOUS
  Filled 2015-08-09: qty 1

## 2015-08-09 SURGICAL SUPPLY — 45 items
APPLIER CLIP ROT 10 11.4 M/L (STAPLE) ×2
BAG COUNTER SPONGE EZ (MISCELLANEOUS) ×2 IMPLANT
CANISTER SUCT 1200ML W/VALVE (MISCELLANEOUS) ×2 IMPLANT
CATH REDDICK CHOLANGI 4FR 50CM (CATHETERS) ×2 IMPLANT
CHLORAPREP W/TINT 26ML (MISCELLANEOUS) ×2 IMPLANT
CLIP APPLIE ROT 10 11.4 M/L (STAPLE) ×1 IMPLANT
CONRAY 60ML FOR OR (MISCELLANEOUS) ×2 IMPLANT
CUTTER FLEX LINEAR 45M (STAPLE) ×2 IMPLANT
DRAIN CHANNEL JP 19F (MISCELLANEOUS) ×2 IMPLANT
DRAPE SHEET LG 3/4 BI-LAMINATE (DRAPES) ×2 IMPLANT
DRSG TEGADERM 2-3/8X2-3/4 SM (GAUZE/BANDAGES/DRESSINGS) ×8 IMPLANT
DRSG TELFA 3X8 NADH (GAUZE/BANDAGES/DRESSINGS) ×2 IMPLANT
ELECT REM PT RETURN 9FT ADLT (ELECTROSURGICAL) ×2
ELECTRODE REM PT RTRN 9FT ADLT (ELECTROSURGICAL) ×1 IMPLANT
GLOVE BIO SURGEON STRL SZ7.5 (GLOVE) ×4 IMPLANT
GLOVE INDICATOR 8.0 STRL GRN (GLOVE) ×4 IMPLANT
GOWN STRL REUS W/ TWL LRG LVL3 (GOWN DISPOSABLE) ×2 IMPLANT
GOWN STRL REUS W/TWL LRG LVL3 (GOWN DISPOSABLE) ×2
GRASPER SUT TROCAR 14GX15 (MISCELLANEOUS) ×2 IMPLANT
IRRIGATION STRYKERFLOW (MISCELLANEOUS) ×1 IMPLANT
IRRIGATOR STRYKERFLOW (MISCELLANEOUS) ×2
IV NS 1000ML (IV SOLUTION) ×1
IV NS 1000ML BAXH (IV SOLUTION) ×1 IMPLANT
LABEL OR SOLS (LABEL) ×2 IMPLANT
NDL SAFETY 18GX1.5 (NEEDLE) ×2 IMPLANT
NEEDLE HYPO 25X1 1.5 SAFETY (NEEDLE) ×2 IMPLANT
NEEDLE INSUFFLATION 14GA 120MM (NEEDLE) ×2 IMPLANT
NS IRRIG 500ML POUR BTL (IV SOLUTION) ×2 IMPLANT
PACK LAP CHOLECYSTECTOMY (MISCELLANEOUS) ×2 IMPLANT
POUCH ENDO CATCH 10MM SPEC (MISCELLANEOUS) ×2 IMPLANT
RELOAD STAPLE TA45 3.5 REG BLU (ENDOMECHANICALS) ×2 IMPLANT
SCISSORS METZENBAUM CVD 33 (INSTRUMENTS) ×2 IMPLANT
SEAL FOR SCOPE WARMER C3101 (MISCELLANEOUS) ×2 IMPLANT
SLEEVE ADV FIXATION 5X100MM (TROCAR) ×2 IMPLANT
SUT ETHIBOND 0 6-8 GRN WK (SUTURE) ×2 IMPLANT
SUT ETHILON 5-0 FS-2 18 BLK (SUTURE) ×2 IMPLANT
SUT VIC AB 0 CT1 36 (SUTURE) ×4 IMPLANT
SUT VIC AB 0 CT2 27 (SUTURE) ×2 IMPLANT
SYR 3ML LL SCALE MARK (SYRINGE) ×6 IMPLANT
TROCAR XCEL 12X100 BLDLESS (ENDOMECHANICALS) ×2 IMPLANT
TROCAR Z-THREAD FIOS 11X100 BL (TROCAR) ×2 IMPLANT
TROCAR Z-THREAD OPTICAL 5X100M (TROCAR) ×2 IMPLANT
TROCAR Z-THREAD SLEEVE 11X100 (TROCAR) ×2 IMPLANT
TUBING INSUFFLATOR HI FLOW (MISCELLANEOUS) ×2 IMPLANT
WATER STERILE IRR 1000ML POUR (IV SOLUTION) ×2 IMPLANT

## 2015-08-09 NOTE — ED Notes (Addendum)
Pt states she took 1 dose of percocet and 25mg  of Phenergan around 0600 this morning. States pain feel like gall bladder attack.  [facility] But states she cannot wait.

## 2015-08-09 NOTE — Anesthesia Procedure Notes (Signed)
Procedure Name: Intubation Date/Time: 08/09/2015 7:51 PM Performed by: Waldo LaineJUSTIS, Arcangel Minion Pre-anesthesia Checklist: Patient identified, Emergency Drugs available, Suction available, Patient being monitored and Timeout performed Patient Re-evaluated:Patient Re-evaluated prior to inductionOxygen Delivery Method: Circle system utilized Preoxygenation: Pre-oxygenation with 100% oxygen Intubation Type: IV induction, Rapid sequence and Cricoid Pressure applied Laryngoscope Size: Miller and 2 Grade View: Grade I Number of attempts: 1 Airway Equipment and Method: Patient positioned with wedge pillow and Stylet Placement Confirmation: ETT inserted through vocal cords under direct vision,  positive ETCO2 and breath sounds checked- equal and bilateral Secured at: 21 cm Tube secured with: Tape Dental Injury: Teeth and Oropharynx as per pre-operative assessment

## 2015-08-09 NOTE — Plan of Care (Signed)
Problem: Fluid Volume: Goal: Ability to maintain a balanced intake and output will improve Outcome: Not Progressing Pt is NPO pending surgical procedure

## 2015-08-09 NOTE — ED Notes (Signed)
Patient transported to Ultrasound 

## 2015-08-09 NOTE — Progress Notes (Signed)
Talked with Dr. Michela PitcherEly about putting pt. on 3rd floor due to limited bed situation tonight. In agreement.

## 2015-08-09 NOTE — ED Provider Notes (Addendum)
[facility] Emergency Department Provider Note  ____________________________________________   I have reviewed the triage vital signs and the nursing notes.   HISTORY  Chief Complaint Abdominal Pain    HPI Jill Moon is a 25 y.o. female patient has a history of gallbladder disease, scheduled for outpatient gallbladder surgery, she states that she had another "attack" that began this morning with significant right upper quadrant pain and vomiting. Denies fever. States that this is similar to prior attacks. Did try to take home medication with no remedy. Pain is sharp right upper quadrant nonradiating. Nothing makes it better nothing makes it worse at this time.      Past Medical History  Diagnosis Date  . Depression   . Polycystic disease, ovaries     Patient Active Problem List   Diagnosis Date Noted  . Symptomatic cholelithiasis 07/29/2015  . BMI 40.0-44.9, adult (HCC) 06/28/2015  . Morbid obesity due to excess calories (HCC) 06/28/2015  . Abortion, spontaneous 05/05/2015  . Clinical depression 05/05/2015  . Irregular menstrual cycle 05/05/2015    Past Surgical History  Procedure Laterality Date  . Tonsillectomy and adenoidectomy      Current Outpatient Rx  Name  Route  Sig  Dispense  Refill  . escitalopram (LEXAPRO) 10 MG tablet   Oral   Take 1 tablet (10 mg total) by mouth at bedtime.   30 tablet   6   . metFORMIN (GLUCOPHAGE) 500 MG tablet   Oral   Take 1 tablet (500 mg total) by mouth daily with breakfast.   30 tablet   6   . oxyCODONE-acetaminophen (PERCOCET) 5-325 MG tablet   Oral   Take 2 tablets by mouth every 6 (six) hours as needed for moderate pain or severe pain.   30 tablet   0   . phentermine 37.5 MG capsule   Oral   Take 1 capsule (37.5 mg total) by mouth every morning.   30 capsule   0   . promethazine (PHENERGAN) 25 MG tablet   Oral   Take 1 tablet (25 mg total) by mouth every 6 (six) hours as  needed for nausea or vomiting.   20 tablet   0   . RECLIPSEN 0.15-30 MG-MCG tablet            8     Dispense as written.   . sulfamethoxazole-trimethoprim (BACTRIM DS,SEPTRA DS) 800-160 MG tablet   Oral   Take 1 tablet by mouth 2 (two) times daily.   14 tablet   0     Allergies Review of patient's allergies indicates no known allergies.  Family History  Problem Relation Age of Onset  . Diabetes Mother     Type 2  . Hypertension Mother   . Hyperlipidemia Mother   . Cancer Paternal Aunt     Ovarian  . Cancer Paternal Grandfather     Liver  . Hypertension Paternal Grandfather     Social History Social History  Substance Use Topics  . Smoking status: Light Tobacco Smoker  . Smokeless tobacco: None  . Alcohol Use: Yes     Comment: occasional    Review of Systems Constitutional: No fever/chills Eyes: No visual changes. ENT: No sore throat. No stiff neck no neck pain Cardiovascular: Denies chest pain. Respiratory: Denies shortness of breath. Gastrointestinal:   + vomiting.  No diarrhea.  No constipation. Genitourinary: Negative for dysuria. Musculoskeletal: Negative lower extremity swelling Skin: Negative for rash. Neurological: Negative for headaches, focal  weakness or numbness. 10-point ROS otherwise negative.  ____________________________________________   PHYSICAL EXAM:  VITAL SIGNS: ED Triage Vitals  Enc Vitals Group     BP 08/09/15 0725 133/85 mmHg     Pulse Rate 08/09/15 0725 86     Resp 08/09/15 0725 18     Temp --      Temp src --      SpO2 08/09/15 0725 100 %     Weight 08/09/15 0725 295 lb (133.811 kg)     Height 08/09/15 0725  (1.778 m)     Head Cir --      Peak Flow --      Pain Score 08/09/15 0716 8     Pain Loc --      Pain Edu? --      Excl. in GC? --     Constitutional: Alert and oriented. Well appearing and in no acute distress.Anxious and upset Eyes: Conjunctivae are normal. PERRL. EOMI. Head: Atraumatic. Nose: No  congestion/rhinnorhea. Mouth/Throat: Mucous membranes are moist.  Oropharynx non-erythematous. Neck: No stridor.   Nontender with no meningismus Cardiovascular: Normal rate, regular rhythm. Grossly normal heart sounds.  Good peripheral circulation. Respiratory: Normal respiratory effort.  No retractions. Lungs CTAB. Abdominal: Positive right upper quadrant focal tenderness with voluntary guarding and no rebound. No distention Back:  There is no focal tenderness or step off there is no midline tenderness there are no lesions noted. there is no CVA tenderness Musculoskeletal: No lower extremity tenderness. No joint effusions, no DVT signs strong distal pulses no edema Neurologic:  Normal speech and language. No gross focal neurologic deficits are appreciated.  Skin:  Skin is warm, dry and intact. No rash noted. Psychiatric: Mood and affect are normal. Speech and behavior are normal.  ____________________________________________   LABS (all labs ordered are listed, but only abnormal results are displayed)  Labs Reviewed  COMPREHENSIVE METABOLIC PANEL  CBC WITH DIFFERENTIAL/PLATELET  LIPASE, BLOOD   ____________________________________________  EKG  I personally interpreted any EKGs ordered by me or triage  ____________________________________________  RADIOLOGY  I reviewed any imaging ordered by me or triage that were performed during my shift and, if possible, patient and/or family made aware of any abnormal findings. ____________________________________________   PROCEDURES  Procedure(s) performed: None  Critical Care performed: None  ____________________________________________   INITIAL IMPRESSION / ASSESSMENT AND PLAN / ED COURSE  Pertinent labs & imaging results that were available during my care of the patient were reviewed by me and considered in my medical decision making (see chart for details).  Patient with nausea vomiting and focal right upper quadrant pain  with context of known right upper quadrant gallbladder disease. We will obtain ultrasound, blood work, give her pain medication nausea medication reassessed and she has a nonsurgical abdomen at this time.  ----------------------------------------- 9:26 AM on 08/09/2015 -----------------------------------------  Dr. Doristine Counter in surgery will call after.  ____________________________________________   FINAL CLINICAL IMPRESSION(S) / ED DIAGNOSES  Final diagnoses:  Abdominal pain      This chart was dictated using voice recognition software.  Despite best efforts to proofread,  errors can occur which can change meaning.     Jeanmarie Plant, MD 08/09/15 1610  Jeanmarie Plant, MD 08/09/15 [phone]

## 2015-08-09 NOTE — OR Nursing (Signed)
Urine pregnancy test- negative, Dr. Maisie Fushomas notifed

## 2015-08-09 NOTE — Anesthesia Preprocedure Evaluation (Addendum)
Anesthesia Evaluation  Patient identified by MRN, date of birth, ID band Patient awake    Reviewed: Allergy & Precautions, NPO status , Patient's Chart, lab work & pertinent test results, reviewed documented beta blocker date and time   Airway Mallampati: III  TM Distance: >3 FB     Dental  (+) Chipped   Pulmonary Current Smoker,           Cardiovascular      Neuro/Psych PSYCHIATRIC DISORDERS Depression    GI/Hepatic   Endo/Other    Renal/GU      Musculoskeletal   Abdominal   Peds  Hematology   Anesthesia Other Findings Obesity. EKG OK.  Reproductive/Obstetrics                            Anesthesia Physical Anesthesia Plan  ASA: III  Anesthesia Plan: General   Post-op Pain Management:    Induction: Intravenous  Airway Management Planned: Oral ETT  Additional Equipment:   Intra-op Plan:   Post-operative Plan:   Informed Consent: I have reviewed the patients History and Physical, chart, labs and discussed the procedure including the risks, benefits and alternatives for the proposed anesthesia with the patient or authorized representative who has indicated his/her understanding and acceptance.     Plan Discussed with: CRNA  Anesthesia Plan Comments:         Anesthesia Quick Evaluation

## 2015-08-09 NOTE — Progress Notes (Signed)
Second ED visit for 24 y.o with know gallstones. Admitted for fluids (vomiting overnight) and planned cholecystectomy. Lungs: Clear. Cardio: RR. ABD: Soft. Minimal RUQ tenderness.  Procedure reviewed.

## 2015-08-09 NOTE — Telephone Encounter (Signed)
Leah from the OR has called and stated that the patient is being admitted and is scheduled to have a surgery with Dr Michela PitcherEly tonight when he comes onto night call tonight. I have canceled the patient's surgery that was scheduled for 08/23/15 with Dr Orvis BrillLoflin for a laparoscopic cholecystectomy.

## 2015-08-09 NOTE — Op Note (Signed)
08/09/2015  9:42 PM  PATIENT:  Jill Moon  25 y.o. female  PRE-OPERATIVE DIAGNOSIS:  GALLSTONE  POST-OPERATIVE DIAGNOSIS:  acute cholelithiasis  PROCEDURE:  Procedure(s): LAPAROSCOPIC CHOLECYSTECTOMY WITH INTRAOPERATIVE CHOLANGIOGRAM (N/A)  SURGEON:  Surgeon(s) and Role:    * Tiney Rouge III, MD - Primary   ASSISTANTS: none   ANESTHESIA:   general  EBL:  Total I/O In: 600 [I.V.:600] Out: -    DRAINS: (1) Jackson-Pratt drain(s) with closed bulb suction in the sub hepatic space   LOCAL MEDICATIONS USED:  MARCAINE      DISPOSITION OF SPECIMEN:  PATHOLOGY   DICTATION: .Dragon Dictation with the patient supine position and after induction of appropriate general anesthesia the patient's abdomen was prepped with ChloraPrep and draped sterile towels. The patient was placed in the headdown feet up position. Small infraumbilical incision was made standard fashion carried down bluntly through the subcutaneous tissue. A varies needle was used to cannulate peritoneal cavity. CO2 was insufflated to appropriate pressure measurements.  Approximately 2 L of CO2 had been instilled a varies needle was withdrawn and 11 mm port placed in the umbilical area. Intraperitoneal position was confirmed and CO2 was reinsufflated. The patient was placed in head up feet down position rotated slightly to the left side. Subxiphoid transverse incision was made 11 mm port inserted under direct vision. The camera was then moved to the upper port and the entry area examined. No injuries were identified.  2 lateral ports 5 mm in size were inserted under direct vision. Hence distended clearly edematous fluid appeared to be calcific changes in the gallbladder wall. An aspiration needle was used to withdraw 30 cc of light green bile but no further bile could be removed. The gallbladder was grasped superiorly and laterally exposing the hepatoduodenal ligament. The ligament was intensely inflamed and dissecting to  that area was extremely difficult.  The cystic duct cystic artery were identified. The duct was quite short and I did not think I could perform a cholangiogram safely with the degree of inflammation noted.  The subxiphoid incision was enlarged to a 12 mm incision and a 12 mm port was inserted in that area. The cystic duct was divided with the Endo GIA stapling device carrying a blue load. The patient did appear to encompass all the cystic duct. Cystic artery was visualized doubly clipped and divided. The gallbladder was then dissected free from its bed in the liver used no cautery apparatus. The dissection was very difficult and I entered the gallbladder at least once. Multiple stones were visualized over captured with a stone scoop and none were lost. Once the gallbladder was free it was placed in an Endo Catch apparatus removed through the subxiphoid incision. The endoscope was irrigated with warm saline solution.  A 19 [place] Blake drain was inserted through the subxiphoid incision brought out through one of the lateral stab was placed in the bed of the liver. The area was examined and no significant abnormalities were bleeding sites were identified. The upper midline incision was closed with 2 figure-of-eight sutures of 0 Vicryl using the suture passer. The repair was accomplished under direct vision. The abdomen was desufflated and all ports withdrawn without difficulty. Skin incisions were closed with 3 and 4-0 nylon. There was infiltrated with 0.25% Marcaine for postoperative pain control. Sterile dressings were applied. The patient was then returned recovery room having tolerated procedure well. Sponge instrument and needle count correct 2 in the operating room.  PLAN OF CARE: Admit to  inpatient   PATIENT DISPOSITION:  PACU - hemodynamically stable.   Tiney Rougealph Ely III, MD

## 2015-08-09 NOTE — H&P (Signed)
Jill Moon is an 25 y.o. female.   Chief Complaint:  Abdominal pain, nausea and vomiting HPI: Second episode of severe abdominal pain with nausea and vomiting. RUQ without radiation. Modest discomfort between first episode May 5 and today. Saw Dr. Azalee Course and scheduled for cholecystectomy later this month. Pain was so severe she presented to ED for assessment.   Past Medical History  Diagnosis Date  . Depression   . Polycystic disease, ovaries     Past Surgical History  Procedure Laterality Date  . Tonsillectomy and adenoidectomy      Family History  Problem Relation Age of Onset  . Diabetes Mother     Type 2  . Hypertension Mother   . Hyperlipidemia Mother   . Cancer Paternal Aunt     Ovarian  . Cancer Paternal Grandfather     Liver  . Hypertension Paternal Grandfather    Social History:  reports that she has been smoking.  She does not have any smokeless tobacco history on file. She reports that she drinks alcohol. She reports that she does not use illicit drugs.  Allergies: No Known Allergies  Facility-administered medications prior to admission  Medication Dose Route Frequency Provider Last Rate Last Dose  . cefOXitin (MEFOXIN) 2 g in dextrose 5 % 50 mL IVPB  2 g Intravenous Once Hubbard Robinson, MD       Medications Prior to Admission  Medication Sig Dispense Refill  . escitalopram (LEXAPRO) 10 MG tablet Take 1 tablet (10 mg total) by mouth at bedtime. 30 tablet 6  . metFORMIN (GLUCOPHAGE) 500 MG tablet Take 1 tablet (500 mg total) by mouth daily with breakfast. 30 tablet 6  . oxyCODONE-acetaminophen (PERCOCET) 5-325 MG tablet Take 2 tablets by mouth every 6 (six) hours as needed for moderate pain or severe pain. 30 tablet 0  . phentermine 37.5 MG capsule Take 1 capsule (37.5 mg total) by mouth every morning. 30 capsule 0  . promethazine (PHENERGAN) 25 MG tablet Take 1 tablet (25 mg total) by mouth every 6 (six) hours as needed for nausea or vomiting. 20 tablet  0  . RECLIPSEN 0.15-30 MG-MCG tablet Take 1 tablet by mouth daily.   8  . sulfamethoxazole-trimethoprim (BACTRIM DS,SEPTRA DS) 800-160 MG tablet Take 1 tablet by mouth 2 (two) times daily.      Results for orders placed or performed during the hospital encounter of 08/09/15 (from the past 48 hour(s))  Comprehensive metabolic panel     Status: Abnormal   Collection Time: 08/09/15  7:48 AM  Result Value Ref Range   Sodium 135 135 - 145 mmol/L   Potassium 4.2 3.5 - 5.1 mmol/L   Chloride 103 101 - 111 mmol/L   CO2 22 22 - 32 mmol/L   Glucose, Bld 157 (H) 65 - 99 mg/dL   BUN 8 6 - 20 mg/dL   Creatinine, Ser 0.62 0.44 - 1.00 mg/dL   Calcium 9.4 8.9 - 10.3 mg/dL   Total Protein 8.3 (H) 6.5 - 8.1 g/dL   Albumin 3.9 3.5 - 5.0 g/dL   AST 78 (H) 15 - 41 U/L   ALT 53 14 - 54 U/L   Alkaline Phosphatase 37 (L) 38 - 126 U/L   Total Bilirubin 0.9 0.3 - 1.2 mg/dL   GFR calc non Af Amer >60 >60 mL/min   GFR calc Af Amer >60 >60 mL/min    Comment: (NOTE) The eGFR has been calculated using the CKD EPI equation. This calculation has  not been validated in all clinical situations. eGFR's persistently <60 mL/min signify possible Chronic Kidney Disease.    Anion gap 10 5 - 15  CBC with Differential     Status: Abnormal   Collection Time: 08/09/15  7:48 AM  Result Value Ref Range   WBC 13.5 (H) 3.6 - 11.0 K/uL   RBC 5.01 3.80 - 5.20 MIL/uL   Hemoglobin 12.8 12.0 - 16.0 g/dL   HCT 39.0 35.0 - 47.0 %   MCV 77.7 (L) 80.0 - 100.0 fL   MCH 25.5 (L) 26.0 - 34.0 pg   MCHC 32.8 32.0 - 36.0 g/dL   RDW 15.9 (H) 11.5 - 14.5 %   Platelets 316 150 - 440 K/uL   Neutrophils Relative % 85% %   Neutro Abs 11.7 (H) 1.4 - 6.5 K/uL   Lymphocytes Relative 7% %   Lymphs Abs 0.9 (L) 1.0 - 3.6 K/uL   Monocytes Relative 6% %   Monocytes Absolute 0.8 0.2 - 0.9 K/uL   Eosinophils Relative 1% %   Eosinophils Absolute 0.1 0 - 0.7 K/uL   Basophils Relative 1% %   Basophils Absolute 0.1 0 - 0.1 K/uL  Lipase, blood      Status: None   Collection Time: 08/09/15  7:48 AM  Result Value Ref Range   Lipase 20 11 - 51 U/L   US Abdomen Limited Ruq  08/09/2015  CLINICAL DATA:  Upper abdominal pain EXAM: US ABDOMEN LIMITED - RIGHT UPPER QUADRANT COMPARISON:  CT abdomen and pelvis Jul 08, 2015 FINDINGS: Gallbladder: Within the gallbladder, there is an echogenic focus which moves and shadows consistent with cholelithiasis. This gallstone measures 1.3 cm in length. There is no gallbladder wall thickening or pericholecystic fluid. No sonographic Murphy sign noted by sonographer. Common bile duct: Diameter: 5 mm. There is no intrahepatic or extrahepatic biliary duct dilatation. Liver: No focal lesion identified.  Liver echogenicity is increased. IMPRESSION: Cholelithiasis. No gallbladder wall thickening or pericholecystic fluid. Increased liver echogenicity, a finding most likely indicative of hepatic steatosis. While no focal liver lesions are identified, it must be cautioned that the sensitivity of ultrasound for focal liver lesions is diminished in this circumstance. Electronically Signed   By: Lowella Grip III M.D.   On: 08/09/2015 08:29    Review of Systems  Constitutional: Negative for fever and chills.  HENT: Negative.   Eyes: Negative.   Respiratory: Negative.   Cardiovascular: Negative.   Gastrointestinal: Positive for nausea, vomiting and abdominal pain.  Musculoskeletal: Negative.   Skin: Negative.   Neurological: Negative.   Endo/Heme/Allergies: Negative.     Blood pressure 128/74, pulse 99, temperature 98.2 F (36.8 C), temperature source Oral, resp. rate 20, height '5\' 10"'$  (1.778 m), weight 295 lb (133.811 kg), SpO2 100 %. Physical Exam  Constitutional: She is oriented to person, place, and time. She appears well-developed and well-nourished.  HENT:  Head: Normocephalic and atraumatic.  Eyes: EOM are normal. Pupils are equal, round, and reactive to light.  Neck: Neck supple. No thyromegaly present.   Cardiovascular: Normal rate and regular rhythm.   Respiratory: Effort normal and breath sounds normal.  GI: Soft. Bowel sounds are normal.  Musculoskeletal: Normal range of motion.  Neurological: She is alert and oriented to person, place, and time. She has normal reflexes.  Skin: Skin is warm and dry.  Psychiatric: She has a normal mood and affect. Her behavior is normal. Judgment and thought content normal.     Assessment  CLINICAL DATA: Upper  abdominal pain  EXAM: US ABDOMEN LIMITED - RIGHT UPPER QUADRANT  COMPARISON: CT abdomen and pelvis Jul 08, 2015  FINDINGS: Gallbladder:  Within the gallbladder, there is an echogenic focus which moves and shadows consistent with cholelithiasis. This gallstone measures 1.3 cm in length. There is no gallbladder wall thickening or pericholecystic fluid. No sonographic Murphy sign noted by sonographer.  Common bile duct:  Diameter: 5 mm. There is no intrahepatic or extrahepatic biliary duct dilatation.  Liver:  No focal lesion identified. Liver echogenicity is increased.  IMPRESSION: Cholelithiasis. No gallbladder wall thickening or pericholecystic fluid. Increased liver echogenicity, a finding most likely indicative of hepatic steatosis. While no focal liver lesions are identified, it must be cautioned that the sensitivity of ultrasound for focal liver lesions is diminished in this circumstance.   Electronically Signed  By: Lowella Grip III M.D.  On: 08/09/2015 08:29  Plan  Recurrent episode of severe pain w/. Nausea and vomiting. For cholecystectomy today.  Robert Bellow, MD 08/09/2015, 4:38 PM

## 2015-08-09 NOTE — Plan of Care (Signed)
Problem: Nutrition: Goal: Adequate nutrition will be maintained Outcome: Not Progressing Pt is NPO pending surgical procedure

## 2015-08-09 NOTE — ED Notes (Signed)
C/o RUQ pain, states "its my gallbladder"

## 2015-08-09 NOTE — Progress Notes (Signed)

## 2015-08-09 NOTE — Transfer of Care (Signed)
Immediate Anesthesia Transfer of Care Note  Patient: Jill Moon  Procedure(s) Performed: Procedure(s): LAPAROSCOPIC CHOLECYSTECTOMY WITH INTRAOPERATIVE CHOLANGIOGRAM (N/A)  Patient Location: PACU  Anesthesia Type:General  Level of Consciousness: sedated and patient cooperative  Airway & Oxygen Therapy: Patient Spontanous Breathing and Patient connected to nasal cannula oxygen  Post-op Assessment: Report given to RN and Post -op Vital signs reviewed and stable  Post vital signs: Reviewed and stable  Last Vitals:  Filed Vitals:   08/09/15 1554 08/09/15 2150  BP: 128/74 137/59  Pulse: 99 123  Temp: 36.8 C 36.6 C  Resp: 20 18    Last Pain:  Filed Vitals:   08/09/15 2150  PainSc: 7       Patients Stated Pain Goal: 1 (08/09/15 1751)  Complications: No apparent anesthesia complications

## 2015-08-09 NOTE — ED Notes (Signed)
IV attempt x1 unsuccessful.  Asked another RN to look for site.

## 2015-08-10 ENCOUNTER — Encounter: Payer: Self-pay | Admitting: Surgery

## 2015-08-10 LAB — COMPREHENSIVE METABOLIC PANEL
ALBUMIN: 3.2 g/dL — AB (ref 3.5–5.0)
ALK PHOS: 34 U/L — AB (ref 38–126)
ALT: 122 U/L — AB (ref 14–54)
ANION GAP: 7 (ref 5–15)
AST: 90 U/L — AB (ref 15–41)
BILIRUBIN TOTAL: 3.1 mg/dL — AB (ref 0.3–1.2)
BUN: 7 mg/dL (ref 6–20)
CALCIUM: 8.8 mg/dL — AB (ref 8.9–10.3)
CO2: 23 mmol/L (ref 22–32)
CREATININE: 0.74 mg/dL (ref 0.44–1.00)
Chloride: 106 mmol/L (ref 101–111)
GFR calc Af Amer: >60 mL/min (ref >60)
GFR calc non Af Amer: >60 mL/min (ref >60)
GLUCOSE: 126 mg/dL — AB (ref 65–99)
Potassium: 4.2 mmol/L (ref 3.5–5.1)
SODIUM: 136 mmol/L (ref 135–145)
TOTAL PROTEIN: 6.9 g/dL (ref 6.5–8.1)

## 2015-08-10 LAB — CBC
HCT: 34.8 % — ABNORMAL LOW (ref 35.0–47.0)
HEMOGLOBIN: 11.7 g/dL — AB (ref 12.0–16.0)
MCH: 26 pg (ref 26.0–34.0)
MCHC: 33.6 g/dL (ref 32.0–36.0)
MCV: 77.3 fL — ABNORMAL LOW (ref 80.0–100.0)
Platelets: 283 10*3/uL (ref 150–440)
RBC: 4.5 MIL/uL (ref 3.80–5.20)
RDW: 16.1 % — AB (ref 11.5–14.5)
WBC: 9.8 10*3/uL (ref 3.6–11.0)

## 2015-08-10 MED ORDER — PANTOPRAZOLE SODIUM 40 MG PO PACK
40.0000 mg | PACK | Freq: Every day | ORAL | Status: DC
  Administered 2015-08-12 – 2015-08-13 (×2): 40 mg via ORAL
  Filled 2015-08-10 (×2): qty 20

## 2015-08-10 MED ORDER — PIPERACILLIN-TAZOBACTAM 3.375 G IVPB
4.5000 g | Freq: Three times a day (TID) | INTRAVENOUS | Status: DC
  Administered 2015-08-10 – 2015-08-12 (×6): 4.5 g via INTRAVENOUS
  Filled 2015-08-10 (×10): qty 100

## 2015-08-10 NOTE — Progress Notes (Signed)
1 Day Post-Op  Subjective: Status post laparoscopic cholecystectomy drain in place. Patient feels well has minimal pain no nausea or vomiting is tolerating a regular diet.  Objective: Vital signs in last 24 hours: Temp:  [97.9 F (36.6 C)-98.9 F (37.2 C)] 98.7 F (37.1 C) (06/07 0912) Pulse Rate:  [83-128] 96 (06/07 0912) Resp:  [15-20] 18 (06/07 0629) BP: (102-140)/(58-104) 118/58 mmHg (06/07 0912) SpO2:  [93 %-100 %] 99 % (06/07 0629) Last BM Date: 08/08/15  Intake/Output from previous day: 06/06 0701 - 06/07 0700 In: 975 [I.V.:975] Out: 320 [Urine:250; Drains:70] Intake/Output this shift: Total I/O In: -  Out: 50 [Drains:50]  Physical exam:  No icterus no jaundice abdomen is soft and minimally tender wounds are dressed there is minimal bile stained fluid in the JP drain calves are nontender  Lab Results: CBC   Recent Labs  08/09/15 0748 08/10/15 0546  WBC 13.5* 9.8  HGB 12.8 11.7*  HCT 39.0 34.8*  PLT 316 283   BMET  Recent Labs  08/09/15 0748 08/10/15 0546  NA 135 136  K 4.2 4.2  CL 103 106  CO2 22 23  GLUCOSE 157* 126*  BUN 8 7  CREATININE 0.62 0.74  CALCIUM 9.4 8.8*   PT/INR No results for input(s): LABPROT, INR in the last 72 hours. ABG No results for input(s): PHART, HCO3 in the last 72 hours.  Invalid input(s): PCO2, PO2  Studies/Results: Kore[place] Abdomen Limited Ruq  08/09/2015  CLINICAL DATA:  Upper abdominal pain EXAM: US ABDOMEN LIMITED - RIGHT UPPER QUADRANT COMPARISON:  CT abdomen and pelvis Jul 08, 2015 FINDINGS: Gallbladder: Within the gallbladder, there is an echogenic focus which moves and shadows consistent with cholelithiasis. This gallstone measures 1.3 cm in length. There is no gallbladder wall thickening or pericholecystic fluid. No sonographic Murphy sign noted by sonographer. Common bile duct: Diameter: 5 mm. There is no intrahepatic or extrahepatic biliary duct dilatation. Liver: No focal lesion identified.  Liver echogenicity is  increased. IMPRESSION: Cholelithiasis. No gallbladder wall thickening or pericholecystic fluid. Increased liver echogenicity, a finding most likely indicative of hepatic steatosis. While no focal liver lesions are identified, it must be cautioned that the sensitivity of ultrasound for focal liver lesions is diminished in this circumstance. Electronically Signed   By: Bretta BangWilliam  Woodruff III M.D.   On: 08/09/2015 08:29    Anti-infectives: Anti-infectives    Start     Dose/Rate Route Frequency Ordered Stop   08/10/15 0800  piperacillin-tazobactam (ZOSYN) IVPB 4.5 g     4.5 g 16.7 mL/hr over 240 Minutes Intravenous Every 8 hours 08/10/15 0528     08/09/15 2300  piperacillin-tazobactam (ZOSYN) IVPB 4.5 g  Status:  Discontinued     4.5 g 200 mL/hr over 30 Minutes Intravenous Every 8 hours 08/09/15 2251 08/10/15 0528      Assessment/Plan: s/p Procedure(s): LAPAROSCOPIC CHOLECYSTECTOMY WITH INTRAOPERATIVE CHOLANGIOGRAM   Status post laparoscopic cholecystectomy with drain in place. There is some bile-stained fluid in the drain and the liver function tests are slightly elevated today with an elevated bilirubin. With the knowledge that this was a very difficult cholecystectomy in the drain is in place this is not too unexpected. I plan to repeat liver function tests tomorrow morning and leave drain in place continue current therapy and probably discharge patient tomorrow if LFTs improved.  Lattie Hawichard E Vietta Bonifield, MD, FACS  08/10/2015

## 2015-08-10 NOTE — Anesthesia Postprocedure Evaluation (Signed)
Anesthesia Post Note  Patient: Jill GreathouseLinzy T Mcchesney  Procedure(s) Performed: Procedure(s) (LRB): LAPAROSCOPIC CHOLECYSTECTOMY WITH INTRAOPERATIVE CHOLANGIOGRAM (N/A)  Patient location during evaluation: PACU Anesthesia Type: General Level of consciousness: awake and alert Pain management: pain level controlled Vital Signs Assessment: post-procedure vital signs reviewed and stable Respiratory status: spontaneous breathing, nonlabored ventilation, respiratory function stable and patient connected to nasal cannula oxygen Cardiovascular status: blood pressure returned to baseline and stable Postop Assessment: no signs of nausea or vomiting Anesthetic complications: no    Last Vitals:  Filed Vitals:   08/10/15 0217 08/10/15 0629  BP: 102/85 104/62  Pulse: 114 100  Temp: 36.8 C 36.8 C  Resp:  18    Last Pain:  Filed Vitals:   08/10/15 0629  PainSc: 5                  Yony Roulston S

## 2015-08-11 ENCOUNTER — Encounter
Admission: EM | Disposition: A | Discharge status: Home / Self Care | Payer: Self-pay | Source: Home / Self Care | Attending: Emergency Medicine

## 2015-08-11 ENCOUNTER — Observation Stay: Payer: BLUE CROSS/BLUE SHIELD | Admitting: Anesthesiology

## 2015-08-11 DIAGNOSIS — E669 Obesity, unspecified: Secondary | ICD-10-CM | POA: Diagnosis not present

## 2015-08-11 DIAGNOSIS — Z8249 Family history of ischemic heart disease and other diseases of the circulatory system: Secondary | ICD-10-CM | POA: Diagnosis not present

## 2015-08-11 DIAGNOSIS — J45909 Unspecified asthma, uncomplicated: Secondary | ICD-10-CM | POA: Diagnosis not present

## 2015-08-11 DIAGNOSIS — K839 Disease of biliary tract, unspecified: Secondary | ICD-10-CM | POA: Diagnosis not present

## 2015-08-11 DIAGNOSIS — Z6841 Body Mass Index (BMI) 40.0 and over, adult: Secondary | ICD-10-CM | POA: Diagnosis not present

## 2015-08-11 DIAGNOSIS — E282 Polycystic ovarian syndrome: Secondary | ICD-10-CM | POA: Diagnosis not present

## 2015-08-11 DIAGNOSIS — K838 Other specified diseases of biliary tract: Secondary | ICD-10-CM

## 2015-08-11 DIAGNOSIS — K9189 Other postprocedural complications and disorders of digestive system: Secondary | ICD-10-CM | POA: Diagnosis not present

## 2015-08-11 DIAGNOSIS — K8066 Calculus of gallbladder and bile duct with acute and chronic cholecystitis without obstruction: Secondary | ICD-10-CM | POA: Diagnosis not present

## 2015-08-11 DIAGNOSIS — K8050 Calculus of bile duct without cholangitis or cholecystitis without obstruction: Secondary | ICD-10-CM | POA: Diagnosis not present

## 2015-08-11 DIAGNOSIS — Z833 Family history of diabetes mellitus: Secondary | ICD-10-CM | POA: Diagnosis not present

## 2015-08-11 DIAGNOSIS — K8062 Calculus of gallbladder and bile duct with acute cholecystitis without obstruction: Secondary | ICD-10-CM | POA: Diagnosis not present

## 2015-08-11 DIAGNOSIS — Z79899 Other long term (current) drug therapy: Secondary | ICD-10-CM | POA: Diagnosis not present

## 2015-08-11 DIAGNOSIS — Z8041 Family history of malignant neoplasm of ovary: Secondary | ICD-10-CM | POA: Diagnosis not present

## 2015-08-11 DIAGNOSIS — F17200 Nicotine dependence, unspecified, uncomplicated: Secondary | ICD-10-CM | POA: Diagnosis not present

## 2015-08-11 DIAGNOSIS — Z800 Family history of malignant neoplasm of digestive organs: Secondary | ICD-10-CM | POA: Diagnosis not present

## 2015-08-11 DIAGNOSIS — E119 Type 2 diabetes mellitus without complications: Secondary | ICD-10-CM | POA: Diagnosis not present

## 2015-08-11 DIAGNOSIS — Z7984 Long term (current) use of oral hypoglycemic drugs: Secondary | ICD-10-CM | POA: Diagnosis not present

## 2015-08-11 DIAGNOSIS — F329 Major depressive disorder, single episode, unspecified: Secondary | ICD-10-CM | POA: Diagnosis not present

## 2015-08-11 HISTORY — PX: ERCP: SHX5425

## 2015-08-11 LAB — COMPREHENSIVE METABOLIC PANEL
ALT: 140 U/L — AB (ref 14–54)
AST: 85 U/L — ABNORMAL HIGH (ref 15–41)
Albumin: 3.2 g/dL — ABNORMAL LOW (ref 3.5–5.0)
Alkaline Phosphatase: 29 U/L — ABNORMAL LOW (ref 38–126)
Anion gap: 6 (ref 5–15)
BUN: 7 mg/dL (ref 6–20)
CHLORIDE: 106 mmol/L (ref 101–111)
CO2: 25 mmol/L (ref 22–32)
CREATININE: 0.71 mg/dL (ref 0.44–1.00)
Calcium: 8.6 mg/dL — ABNORMAL LOW (ref 8.9–10.3)
Glucose, Bld: 113 mg/dL — ABNORMAL HIGH (ref 65–99)
Potassium: 3.4 mmol/L — ABNORMAL LOW (ref 3.5–5.1)
Sodium: 137 mmol/L (ref 135–145)
Total Bilirubin: 1.3 mg/dL — ABNORMAL HIGH (ref 0.3–1.2)
Total Protein: 6.8 g/dL (ref 6.5–8.1)

## 2015-08-11 LAB — CBC WITH DIFFERENTIAL/PLATELET
Basophils Absolute: 0 10*3/uL (ref 0–0.1)
Basophils Relative: 1 %
EOS PCT: 1 %
Eosinophils Absolute: 0.1 10*3/uL (ref 0–0.7)
HCT: 34.3 % — ABNORMAL LOW (ref 35.0–47.0)
Hemoglobin: 11.4 g/dL — ABNORMAL LOW (ref 12.0–16.0)
Lymphocytes Relative: 17 %
Lymphs Abs: 1.2 10*3/uL (ref 1.0–3.6)
MCH: 26.1 pg (ref 26.0–34.0)
MCHC: 33.3 g/dL (ref 32.0–36.0)
MCV: 78.4 fL — AB (ref 80.0–100.0)
MONO ABS: 0.5 10*3/uL (ref 0.2–0.9)
Monocytes Relative: 7 %
NEUTROS PCT: 74 %
Neutro Abs: 5.4 10*3/uL (ref 1.4–6.5)
PLATELETS: 234 10*3/uL (ref 150–440)
RBC: 4.37 MIL/uL (ref 3.80–5.20)
RDW: 16.3 % — AB (ref 11.5–14.5)
WBC: 7.3 10*3/uL (ref 3.6–11.0)

## 2015-08-11 LAB — PREGNANCY, URINE: PREG TEST UR: NEGATIVE

## 2015-08-11 LAB — SURGICAL PATHOLOGY

## 2015-08-11 SURGERY — ENDOSCOPIC RETROGRADE CHOLANGIOPANCREATOGRAPHY (ERCP) WITH PROPOFOL
Anesthesia: Monitor Anesthesia Care

## 2015-08-11 SURGERY — ERCP, WITH INTERVENTION IF INDICATED
Anesthesia: General

## 2015-08-11 MED ORDER — PROPOFOL 10 MG/ML IV BOLUS
INTRAVENOUS | Status: DC | PRN
  Administered 2015-08-11: 200 mg via INTRAVENOUS

## 2015-08-11 MED ORDER — FENTANYL CITRATE (PF) 100 MCG/2ML IJ SOLN: 25.0000 ug | INTRAMUSCULAR | Status: DC | PRN

## 2015-08-11 MED ORDER — LIDOCAINE HCL (CARDIAC) 20 MG/ML IV SOLN
INTRAVENOUS | Status: DC | PRN
  Administered 2015-08-11: 6 mg via INTRAVENOUS

## 2015-08-11 MED ORDER — SUCCINYLCHOLINE CHLORIDE 20 MG/ML IJ SOLN
INTRAMUSCULAR | Status: DC | PRN
  Administered 2015-08-11: 100 mg via INTRAVENOUS

## 2015-08-11 MED ORDER — IPRATROPIUM-ALBUTEROL 0.5-2.5 (3) MG/3ML IN SOLN
3.0000 mL | Freq: Once | RESPIRATORY_TRACT | Status: AC
  Administered 2015-08-11: 3 mL via RESPIRATORY_TRACT
  Filled 2015-08-11: qty 3

## 2015-08-11 MED ORDER — FENTANYL CITRATE (PF) 100 MCG/2ML IJ SOLN
INTRAMUSCULAR | Status: DC | PRN
  Administered 2015-08-11: 100 ug via INTRAVENOUS

## 2015-08-11 MED ORDER — ONDANSETRON HCL 4 MG/2ML IJ SOLN: 4.0000 mg | Freq: Once | INTRAMUSCULAR | Status: DC | PRN

## 2015-08-11 MED ORDER — ROCURONIUM BROMIDE 100 MG/10ML IV SOLN
INTRAVENOUS | Status: DC | PRN
Start: 2015-08-11 — End: 2015-08-11
  Administered 2015-08-11: 5 mg via INTRAVENOUS
  Administered 2015-08-11: 15 mg via INTRAVENOUS

## 2015-08-11 MED ORDER — IPRATROPIUM-ALBUTEROL 0.5-2.5 (3) MG/3ML IN SOLN
RESPIRATORY_TRACT | Status: AC
  Administered 2015-08-11: 3 mL via RESPIRATORY_TRACT
  Filled 2015-08-11: qty 3

## 2015-08-11 MED ORDER — SUGAMMADEX SODIUM 200 MG/2ML IV SOLN
INTRAVENOUS | Status: DC | PRN
  Administered 2015-08-11: 270 mg via INTRAVENOUS

## 2015-08-11 MED ORDER — INDOMETHACIN 50 MG RE SUPP
RECTAL | Status: DC | PRN
  Administered 2015-08-11: 100 mg via RECTAL

## 2015-08-11 MED ORDER — INDOMETHACIN 50 MG RE SUPP
100.0000 mg | Freq: Once | RECTAL | Status: DC
  Filled 2015-08-11: qty 2

## 2015-08-11 MED ORDER — DEXAMETHASONE SODIUM PHOSPHATE 10 MG/ML IJ SOLN
INTRAMUSCULAR | Status: DC | PRN
  Administered 2015-08-11: 5 mg via INTRAVENOUS

## 2015-08-11 MED ORDER — ONDANSETRON HCL 4 MG/2ML IJ SOLN
INTRAMUSCULAR | Status: DC | PRN
  Administered 2015-08-11: 4 mg via INTRAVENOUS

## 2015-08-11 MED ORDER — MIDAZOLAM HCL 5 MG/5ML IJ SOLN
INTRAMUSCULAR | Status: DC | PRN
  Administered 2015-08-11: 2 mg via INTRAVENOUS

## 2015-08-11 NOTE — Progress Notes (Signed)
Discuss care with Dr. Servando SnareWohl. He is able to do an ERCP this afternoon which would be both diagnostic and therapeutic in this patient with an obvious bile leak. Therefore HIDA scan is not necessary in this patient. I will cancel the HIDA scan and proceed directly to GI consult an ERCP with Dr. Servando SnareWohl who will see the patient. She has been made nothing by mouth but did have breakfast this morning. This is discussed with the RN who will relay this message to family as to why the the ERCP is preferred over the HIDA scan.

## 2015-08-11 NOTE — Anesthesia Postprocedure Evaluation (Signed)
Anesthesia Post Note  Patient: Brynda GreathouseLinzy T Brinkman  Procedure(s) Performed: Procedure(s) (LRB): ENDOSCOPIC RETROGRADE CHOLANGIOPANCREATOGRAPHY (ERCP) (N/A)  Patient location during evaluation: PACU Anesthesia Type: General Level of consciousness: awake Pain management: pain level controlled Vital Signs Assessment: post-procedure vital signs reviewed and stable Respiratory status: spontaneous breathing Cardiovascular status: stable Anesthetic complications: no    Last Vitals:  Filed Vitals:   08/11/15 1827 08/11/15 1841  BP: 125/84 132/86  Pulse: 101 99  Temp:  36.4 C  Resp: 15 17    Last Pain:  Filed Vitals:   08/11/15 1841  PainSc: Asleep                 VAN STAVEREN,Nai Borromeo

## 2015-08-11 NOTE — Op Note (Signed)
[facility] Gastroenterology Patient Name: Jill Moon Procedure Date: 08/11/2015 5:04 PM MRN: [id number] Account #: [id number] Date of Birth: Feb 15, 1991 Admit Type: Inpatient Age: 25 Room: [facility] ENDO ROOM 3 Gender: Female Note Status: Finalized Procedure:            ERCP Indications:          Bile leak Providers:            Midge Minium, MD Referring MD:         No Local Md, MD (Referring MD) Medicines:            General Anesthesia Complications:        No immediate complications. Procedure:            Pre-Anesthesia Assessment:                       - Prior to the procedure, a History and Physical was                        performed, and patient medications and allergies were                        reviewed. The patient's tolerance of previous                        anesthesia was also reviewed. The risks and benefits of                        the procedure and the sedation options and risks were                        discussed with the patient. All questions were                        answered, and informed consent was obtained. Prior                        Anticoagulants: The patient has taken no previous                        anticoagulant or antiplatelet agents. ASA Grade                        Assessment: II - A patient with mild systemic disease.                        After reviewing the risks and benefits, the patient was                        deemed in satisfactory condition to undergo the                        procedure.                       After obtaining informed consent, the scope was passed                        under direct vision. Throughout the procedure, the  patient's blood pressure, pulse, and oxygen saturations                        were monitored continuously. The Endoscope was                        introduced through the mouth, and used to inject                        contrast into and used to inject  contrast into the bile                        duct. The ERCP was accomplished without difficulty. The                        patient tolerated the procedure well. Findings:      A scout film of the abdomen was obtained. Surgical clips, consistent       with a previous cholecystectomy, were seen in the area of the right       upper quadrant of the abdomen. The major papilla was normal. The bile       duct was deeply cannulated. Contrast was injected. I personally       interpreted the bile duct images. There was brisk flow of contrast       through the ducts. Image quality was excellent. Contrast extended to the       entire biliary tree. Extravasation of contrast originating from the       right intrahepatic branches was observed. The lower third of the main       bile duct contained one stone mm. A wire was passed into the biliary       tree. Biliary sphincterotomy was made with a traction (standard)       sphincterotome using ERBE electrocautery. There was no       post-sphincterotomy bleeding. The biliary tree was swept with a 15 mm       balloon starting at the bifurcation. One stone was removed. No stones       remained. One 10 Fr by 7 cm plastic stent was placed 5 cm into the       common bile duct. Bile flowed through the stent. The stent was in good       position. Impression:           - The major papilla appeared normal.                       - A bile leak was found.                       - Choledocholithiasis was found. Complete removal was                        accomplished by biliary sphincterotomy and balloon                        extraction.                       - A biliary sphincterotomy was performed.                       -  The biliary tree was swept.                       - One plastic stent was placed into the common bile                        duct. Recommendation:       - Watch for pancreatitis, bleeding, perforation, and                        cholangitis.                        - Repeat ERCP in 3 months to remove stent. Procedure Code(s):    --- Professional ---                       [id number], Endoscopic retrograde cholangiopancreatography                        (ERCP); with placement of endoscopic stent into biliary                        or pancreatic duct, including pre- and post-dilation                        and guide wire passage, when performed, including                        sphincterotomy, when performed, each stent                       [id number], Endoscopic retrograde cholangiopancreatography                        (ERCP); with removal of calculi/debris from                        biliary/pancreatic duct(s)                       [id number], Endoscopic catheterization of the biliary ductal                        system, radiological supervision and interpretation Diagnosis Code(s):    --- Professional ---                       K83.8, Other specified diseases of biliary tract                       K83.9, Disease of biliary tract, unspecified                       K80.50, Calculus of bile duct without cholangitis or                        cholecystitis without obstruction CPT copyright 2016 American Medical Association. All rights reserved. The codes documented in this report are preliminary and upon coder review may  be revised to meet current compliance requirements. Midge Miniumarren Maurie Olesen, MD 08/11/2015 6:01:30 PM This report has been signed electronically. Number of Addenda: 0 Note Initiated On: 08/11/2015 5:04 PM        [facility]

## 2015-08-11 NOTE — Anesthesia Preprocedure Evaluation (Signed)
Anesthesia Evaluation  Patient identified by MRN, date of birth, ID band Patient awake    Reviewed: Allergy & Precautions, NPO status , Patient's Chart, lab work & pertinent test results  Airway Mallampati: II       Dental  (+) Teeth Intact   Pulmonary asthma , Current Smoker,     + decreased breath sounds      Cardiovascular Exercise Tolerance: Good  Rhythm:Regular Rate:Normal     Neuro/Psych Depression    GI/Hepatic negative GI ROS, Neg liver ROS,   Endo/Other  diabetes, Oral Hypoglycemic AgentsMorbid obesity  Renal/GU      Musculoskeletal   Abdominal (+) + obese,   Peds  Hematology   Anesthesia Other Findings   Reproductive/Obstetrics                             Anesthesia Physical Anesthesia Plan  ASA: III  Anesthesia Plan: General   Post-op Pain Management:    Induction: Intravenous  Airway Management Planned: Oral ETT  Additional Equipment:   Intra-op Plan:   Post-operative Plan: Extubation in OR  Informed Consent: I have reviewed the patients History and Physical, chart, labs and discussed the procedure including the risks, benefits and alternatives for the proposed anesthesia with the patient or authorized representative who has indicated his/her understanding and acceptance.     Plan Discussed with: CRNA  Anesthesia Plan Comments:         Anesthesia Quick Evaluation

## 2015-08-11 NOTE — Progress Notes (Signed)
Patient seen in PACU post ERCP with stone extraction and stenting for a duct of Luschka leak. Discussed case with RN as well as Dr. Servando SnareWohl Anticipate discharge in the next 24-48 hours scuffs with patient's husband

## 2015-08-11 NOTE — Transfer of Care (Signed)
Immediate Anesthesia Transfer of Care Note  Patient: Jill Moon  Procedure(s) Performed: Procedure(s): ENDOSCOPIC RETROGRADE CHOLANGIOPANCREATOGRAPHY (ERCP) (N/A)  Patient Location: PACU  Anesthesia Type:General  Level of Consciousness: awake  Airway & Oxygen Therapy: Patient connected to face mask oxygen  Post-op Assessment: Post -op Vital signs reviewed and stable  Post vital signs: stable  Last Vitals:  Filed Vitals:   08/11/15 1610 08/11/15 1813  BP: 117/72 129/83  Pulse: 99   Temp: 36.9 C 36.2 C  Resp: 18 28    Last Pain:  Filed Vitals:   08/11/15 1813  PainSc: 8       Patients Stated Pain Goal: 1 (08/09/15 1751)  Complications: No apparent anesthesia complications

## 2015-08-11 NOTE — Anesthesia Procedure Notes (Signed)
Procedure Name: Intubation Date/Time: 08/11/2015 5:06 PM Performed by: Jill KocherPERALTA, Jill Speyer Pre-anesthesia Checklist: Patient identified, Patient being monitored, Timeout performed, Emergency Drugs available and Suction available Patient Re-evaluated:Patient Re-evaluated prior to inductionOxygen Delivery Method: Circle system utilized Preoxygenation: Pre-oxygenation with 100% oxygen Intubation Type: IV induction Ventilation: Mask ventilation without difficulty Laryngoscope Size: Mac and 3 Grade View: Grade I Tube type: Oral Tube size: 7.0 mm Number of attempts: 1 Airway Equipment and Method: Stylet Placement Confirmation: ETT inserted through vocal cords under direct vision,  positive ETCO2 and breath sounds checked- equal and bilateral Secured at: 21 cm Tube secured with: Tape Dental Injury: Teeth and Oropharynx as per pre-operative assessment

## 2015-08-11 NOTE — Progress Notes (Signed)
2 Days Post-Op  Subjective: Patient has mild right upper quadrant pain and is tolerating a regular diet. She has no fevers or chills. She has had fairly high output from her drain.  Objective: Vital signs in last 24 hours: Temp:  [98 F (36.7 C)-98.7 F (37.1 C)] 98 F (36.7 C) (06/08 0716) Pulse Rate:  [77-111] 95 (06/08 0716) Resp:  [18-20] 18 (06/08 0716) BP: (117-139)/(58-85) 117/71 mmHg (06/08 0716) SpO2:  [95 %-98 %] 95 % (06/08 0716) Last BM Date: 08/08/15  Intake/Output from previous day: 06/07 0701 - 06/08 0700 In: 240 [P.O.:240] Out: 2890 [Urine:2250; Drains:640] Intake/Output this shift:    Physical exam:  Patient appears quite comfortable vital signs are stable she is afebrile. Drain output approaches 640 mL's and his bili is abdomen is otherwise soft and nontender dressings are dry and intact otherwise. Calves are nontender. No icterus no jaundice  Lab Results: CBC   Recent Labs  08/10/15 0546 08/11/15 0533  WBC 9.8 7.3  HGB 11.7* 11.4*  HCT 34.8* 34.3*  PLT 283 234   BMET  Recent Labs  08/10/15 0546 08/11/15 0533  NA 136 137  K 4.2 3.4*  CL 106 106  CO2 23 25  GLUCOSE 126* 113*  BUN 7 7  CREATININE 0.74 0.71  CALCIUM 8.8* 8.6*   PT/INR No results for input(s): LABPROT, INR in the last 72 hours. ABG No results for input(s): PHART, HCO3 in the last 72 hours.  Invalid input(s): PCO2, PO2  Studies/Results: No results found.  Anti-infectives: Anti-infectives    Start     Dose/Rate Route Frequency Ordered Stop   08/10/15 0800  piperacillin-tazobactam (ZOSYN) IVPB 4.5 g     4.5 g 16.7 mL/hr over 240 Minutes Intravenous Every 8 hours 08/10/15 0528     08/09/15 2300  piperacillin-tazobactam (ZOSYN) IVPB 4.5 g  Status:  Discontinued     4.5 g 200 mL/hr over 30 Minutes Intravenous Every 8 hours 08/09/15 2251 08/10/15 0528      Assessment/Plan: s/p Procedure(s): LAPAROSCOPIC CHOLECYSTECTOMY WITH INTRAOPERATIVE  CHOLANGIOGRAM   Bilirubin is normalized. But there is a high output, 640 cc, bilious drainage suggestive of biliary leak. We'll obtain a HIDA scan today and a GI consult to consider ERCP, stenting. As discussed with the patient and her husband. Her understanding of this plan.  Lattie Hawichard E Cooper, MD, FACS  08/11/2015

## 2015-08-12 ENCOUNTER — Encounter: Payer: Self-pay | Admitting: Gastroenterology

## 2015-08-12 DIAGNOSIS — K838 Other specified diseases of biliary tract: Secondary | ICD-10-CM | POA: Diagnosis not present

## 2015-08-12 DIAGNOSIS — K8050 Calculus of bile duct without cholangitis or cholecystitis without obstruction: Secondary | ICD-10-CM | POA: Diagnosis not present

## 2015-08-12 LAB — CBC WITH DIFFERENTIAL/PLATELET
BASOS PCT: 0 %
Basophils Absolute: 0 10*3/uL (ref 0–0.1)
EOS ABS: 0 10*3/uL (ref 0–0.7)
EOS PCT: 0 %
HCT: 32.1 % — ABNORMAL LOW (ref 35.0–47.0)
Hemoglobin: 10.8 g/dL — ABNORMAL LOW (ref 12.0–16.0)
LYMPHS ABS: 0.9 10*3/uL — AB (ref 1.0–3.6)
Lymphocytes Relative: 16 %
MCH: 25.6 pg — AB (ref 26.0–34.0)
MCHC: 33.7 g/dL (ref 32.0–36.0)
MCV: 76.1 fL — ABNORMAL LOW (ref 80.0–100.0)
Monocytes Absolute: 0.4 10*3/uL (ref 0.2–0.9)
Monocytes Relative: 7 %
NEUTROS PCT: 77 %
Neutro Abs: 4.6 10*3/uL (ref 1.4–6.5)
PLATELETS: 250 10*3/uL (ref 150–440)
RBC: 4.22 MIL/uL (ref 3.80–5.20)
RDW: 15.7 % — ABNORMAL HIGH (ref 11.5–14.5)
WBC: 6 10*3/uL (ref 3.6–11.0)

## 2015-08-12 LAB — COMPREHENSIVE METABOLIC PANEL
ALBUMIN: 3.2 g/dL — AB (ref 3.5–5.0)
ALT: 213 U/L — AB (ref 14–54)
ANION GAP: 9 (ref 5–15)
AST: 120 U/L — ABNORMAL HIGH (ref 15–41)
Alkaline Phosphatase: 26 U/L — ABNORMAL LOW (ref 38–126)
BUN: 5 mg/dL — ABNORMAL LOW (ref 6–20)
CHLORIDE: 105 mmol/L (ref 101–111)
CO2: 25 mmol/L (ref 22–32)
CREATININE: 0.63 mg/dL (ref 0.44–1.00)
Calcium: 8.7 mg/dL — ABNORMAL LOW (ref 8.9–10.3)
GFR calc non Af Amer: >60 mL/min (ref >60)
GLUCOSE: 99 mg/dL (ref 65–99)
Potassium: 3.6 mmol/L (ref 3.5–5.1)
SODIUM: 139 mmol/L (ref 135–145)
Total Bilirubin: 0.4 mg/dL (ref 0.3–1.2)
Total Protein: 7.2 g/dL (ref 6.5–8.1)

## 2015-08-12 MED ORDER — AMOXICILLIN-POT CLAVULANATE 875-125 MG PO TABS
1.0000 | ORAL_TABLET | Freq: Two times a day (BID) | ORAL | Status: DC
  Administered 2015-08-12 – 2015-08-13 (×2): 1 via ORAL
  Filled 2015-08-12 (×3): qty 1

## 2015-08-12 NOTE — Progress Notes (Signed)
1 Day Post-Op   Subjective:  She is feeling better today. She does not have any significant abdominal pain. She's had minimal drainage over the last 24 hours. She is tolerating a liquid diet. Compared to her pre-ERCP states she is much improved.  Vital signs in last 24 hours: Temp:  [97.1 F (36.2 C)-99.4 F (37.4 C)] 97.8 F (36.6 C) (06/09 1153) Pulse Rate:  [82-106] 89 (06/09 1153) Resp:  [15-28] 20 (06/09 1153) BP: (107-132)/(59-86) 120/65 mmHg (06/09 1153) SpO2:  [93 %-100 %] 99 % (06/09 0719) Last BM Date: 08/08/15  Intake/Output from previous day: 06/08 0701 - 06/09 0700 In: 1640 [P.O.:240; I.V.:1400] Out: 1390 [Urine:1250; Drains:140]  GI: Her abdomen is soft with minimal abdominal tenderness. She has some slight bile tinged fluid in her drain. She has no rebound or guarding.  Lab Results:  CBC  Recent Labs  08/11/15 0533 08/12/15 0657  WBC 7.3 6.0  HGB 11.4* 10.8*  HCT 34.3* 32.1*  PLT 234 250   CMP     Component Value Date/Time   NA 139 08/12/2015 0657   NA 138 10/27/2013   K 3.6 08/12/2015 0657   CL 105 08/12/2015 0657   CO2 25 08/12/2015 0657   GLUCOSE 99 08/12/2015 0657   BUN 5* 08/12/2015 0657   BUN 6 10/27/2013   CREATININE 0.63 08/12/2015 0657   CREATININE 0.9 10/27/2013   CALCIUM 8.7* 08/12/2015 0657   PROT 7.2 08/12/2015 0657   ALBUMIN 3.2* 08/12/2015 0657   AST 120* 08/12/2015 0657   ALT 213* 08/12/2015 0657   ALKPHOS 26* 08/12/2015 0657   BILITOT 0.4 08/12/2015 0657   GFRNONAA >60 08/12/2015 0657   GFRAA >60 08/12/2015 0657   PT/INR No results for input(s): LABPROT, INR in the last 72 hours.  Studies/Results: No results found.  Assessment/Plan: She is improving status post her ERCP. I have reviewed the workup with the ERCP notes. Her bilirubin is back to 0.4. She needs follow-up EGD to remove the stent several weeks. I spent significant amount of time with the patient and her mother outlining the problems with the surgery the reason  for her complication and the plans for the future. They are in agreement with the current plan. We will likely discharge her home with a drain in place. They are in agreement and appear to understand.

## 2015-08-12 NOTE — Progress Notes (Signed)
Dr. Michela PitcherEly paged.Pt status report. Pt IV leaking.  IV D/CD.  MD notified because pt was on Zosyn IV.  MD will place pt on PO antibiotics.

## 2015-08-12 NOTE — Progress Notes (Signed)
  Jill Miniumarren Karrissa Parchment, MD [facility]   [address] [place], [place] [address] Phone: [phone] Fax : [phone]   Subjective: The patient states she is feeling much better today. The patient has had less output from her drain that she did pre-ERCP. The patient has had no nausea vomiting or abdominal pain since the ERCP.   Objective: Vital signs in last 24 hours: Filed Vitals:   08/11/15 2334 08/12/15 0312 08/12/15 0719 08/12/15 1153  BP: 121/76 107/59 113/61 120/65  Pulse: 101 84 91 89  Temp: 98.2 F (36.8 C) 98.1 F (36.7 C) 98.5 F (36.9 C) 97.8 F (36.6 C)  TempSrc: Oral Oral Oral Oral  Resp: 20 20 18 20   Height:      Weight:      SpO2: 96% 97% 99%    Weight change:   Intake/Output Summary (Last 24 hours) at 08/12/15 1345 Last data filed at 08/12/15 0900  Gross per 24 hour  Intake   1640 ml  Output    900 ml  Net    740 ml     Exam: Alert and orientated 3 sitting up in the bed in no apparent distress. No jaundice no rashes or lesions   Lab Results: @LABTEST2 @ Micro Results: Recent Results (from the past 240 hour(s))  Surgical pcr screen     Status: None   Collection Time: 08/09/15  7:10 PM  Result Value Ref Range Status   MRSA, PCR NEGATIVE NEGATIVE Final   Staphylococcus aureus NEGATIVE NEGATIVE Final    Comment:        The Xpert SA Assay (FDA approved for NASAL specimens in patients over 25 years of age), is one component of a comprehensive surveillance program.  Test performance has been validated by [facility] for patients greater than or equal to 25 year old. It is not intended to diagnose infection nor to guide or monitor treatment.    Studies/Results: No results found. Medications: I have reviewed the patient's current medications. Scheduled Meds: . enoxaparin (LOVENOX) injection  40 mg Subcutaneous Q24H  . indomethacin  100 mg Rectal Once  . pantoprazole sodium  40 mg Oral Daily  . piperacillin-tazobactam (ZOSYN)  IV  4.5 g  Intravenous Q8H   Continuous Infusions: . sodium chloride 75 mL/hr at 08/12/15 1318   PRN Meds:.acetaminophen **OR** acetaminophen, HYDROcodone-acetaminophen, HYDROmorphone (DILAUDID) injection, ondansetron **OR** ondansetron (ZOFRAN) IV   Assessment: Active Problems:   Cholecystitis with cholelithiasis   Common bile duct leak   Disease of biliary tract   Stones common duct   Biliary colic    Plan: This patient is status post ERCP with stent placement. The patient will need a repeat ERCP in 2-3 months to have the stent removed. The patient has been explained this and has been told to contact my office. The patient did not have any sign of post ERCP pancreatitis and has been doing well. I will sign off at this time if you have any questions please do not hesitate to call.     Allan Minotti 08/12/2015, 1:45 PM

## 2015-08-13 LAB — COMPREHENSIVE METABOLIC PANEL
ALT: 166 U/L — ABNORMAL HIGH (ref 14–54)
ANION GAP: 7 (ref 5–15)
AST: 70 U/L — ABNORMAL HIGH (ref 15–41)
Albumin: 2.8 g/dL — ABNORMAL LOW (ref 3.5–5.0)
Alkaline Phosphatase: 20 U/L — ABNORMAL LOW (ref 38–126)
BILIRUBIN TOTAL: 0.5 mg/dL (ref 0.3–1.2)
BUN: 9 mg/dL (ref 6–20)
CHLORIDE: 107 mmol/L (ref 101–111)
CO2: 25 mmol/L (ref 22–32)
Calcium: 8.3 mg/dL — ABNORMAL LOW (ref 8.9–10.3)
Creatinine, Ser: 0.66 mg/dL (ref 0.44–1.00)
Glucose, Bld: 92 mg/dL (ref 65–99)
POTASSIUM: 3.1 mmol/L — AB (ref 3.5–5.1)
Sodium: 139 mmol/L (ref 135–145)
TOTAL PROTEIN: 6.1 g/dL — AB (ref 6.5–8.1)

## 2015-08-13 MED ORDER — AMOXICILLIN-POT CLAVULANATE 875-125 MG PO TABS: 1.0000 | ORAL_TABLET | Freq: Two times a day (BID) | ORAL | Status: DC

## 2015-08-13 MED ORDER — HYDROCODONE-ACETAMINOPHEN 5-325 MG PO TABS: 1.0000 | ORAL_TABLET | ORAL | Status: DC | PRN

## 2015-08-13 NOTE — Progress Notes (Signed)
Pt discharged home.  Discharge instructions, prescriptions and follow up appointment given to and reviewed with pt.  Pt verbalized understanding.  Escorted by auxillary. 

## 2015-08-13 NOTE — Progress Notes (Signed)
Subjective:   She continues to improve. She feels better this morning with less abdominal pain no nausea and no vomiting. She is tolerating a regular diet. Her bilirubin remains low. She had approximately 50 cc of straw-colored fluid in her JP drain last night.  Vital signs in last 24 hours: Temp:  [97.8 F (36.6 C)-98.5 F (36.9 C)] 98.1 F (36.7 C) (06/10 0835) Pulse Rate:  [86-95] 86 (06/10 0835) Resp:  [18-20] 18 (06/10 0835) BP: (117-132)/(65-75) 117/75 mmHg (06/10 0835) SpO2:  [99 %] 99 % (06/10 0835) Last BM Date: 08/08/15  Intake/Output from previous day: 06/09 0701 - 06/10 0700 In: 2640 [P.O.:2640] Out: 1975 [Urine:1900; Drains:75]  Exam:  Her abdomen is soft with minimal incisional tenderness. She is no significant wound problems.  Lab Results:  CBC  Recent Labs  08/11/15 0533 08/12/15 0657  WBC 7.3 6.0  HGB 11.4* 10.8*  HCT 34.3* 32.1*  PLT 234 250   CMP     Component Value Date/Time   NA 139 08/13/2015 0623   NA 138 10/27/2013   K 3.1* 08/13/2015 0623   CL 107 08/13/2015 0623   CO2 25 08/13/2015 0623   GLUCOSE 92 08/13/2015 0623   BUN 9 08/13/2015 0623   BUN 6 10/27/2013   CREATININE 0.66 08/13/2015 0623   CREATININE 0.9 10/27/2013   CALCIUM 8.3* 08/13/2015 0623   PROT 6.1* 08/13/2015 0623   ALBUMIN 2.8* 08/13/2015 0623   AST 70* 08/13/2015 0623   ALT 166* 08/13/2015 0623   ALKPHOS 20* 08/13/2015 0623   BILITOT 0.5 08/13/2015 0623   GFRNONAA >60 08/13/2015 0623   GFRAA >60 08/13/2015 0623   PT/INR No results for input(s): LABPROT, INR in the last 72 hours.  Studies/Results: No results found.  Assessment/Plan: She continues to improve. She remains on oral antibiotics. We will increase her activity today follow her drainage one more evening. If it remains low talk about taking out her Jackson-Pratt drain tomorrow. Otherwise her back in the office later next week for drain removal. We anticipate discharge tomorrow. She is in agreement.

## 2015-08-13 NOTE — Discharge Summary (Signed)
Patient ID: Jill GreathouseLinzy T Ruddell MRN: [id number] DOB/AGE: Jul 29, 1990 25 y.o.  Admit date: 08/09/2015 Discharge date: 08/13/2015  Discharge Diagnoses:  Acute cholecystitis and cholelithiasis, bile duct leak  Procedures Performed: Laparoscopic cholecystectomy, ERCPgood  Discharged Condition: good  Hospital Course: She was admitted to Hospital on 08/09/2015 with what appeared to be biliary colic and possible early acute cholecystitis. She was taken to surgery that evening where she's found to have a significantly edematous and inflamed gallbladder. Surgery was quite difficult and it this dissection very tedious. She did well postoperatively but on the second operative day she developed some significant abdominal pain. She had a drain placed at this time of original surgery and she began to drain significant amounts of bile from the drain. Her bilirubin was elevated to 3. The GI service was consulted and recommended an ERCP. That procedure demonstrated a retained common bile duct stone with a small leak from the bed of the liver consistent with a duct of Luschka. A stent was placed. She has continued to recover uneventfully since that time. Her drainage has decreased dramatically and she's had no further abdominal pain. She's tolerating a regular diet. She'll be discharged home today to be found the office in several days for drain removal. This plan was discussed with the patient and her family.  Discharge Orders: Discharge Instructions    Diet - low sodium heart healthy    Complete by:  As directed      Increase activity slowly    Complete by:  As directed      Other Restrictions    Complete by:  As directed   Do not bathe until drain removed           Disposition: 01-Home or Self Care  Discharge Medications:  Current facility-administered medications:  .  acetaminophen (TYLENOL) tablet 650 mg, 650 mg, Oral, Q6H PRN **OR** acetaminophen (TYLENOL) suppository 650 mg, 650 mg, Rectal, Q6H  PRN, Tiney Rougealph Ely III, MD .  amoxicillin-clavulanate (AUGMENTIN) 875-125 MG per tablet 1 tablet, 1 tablet, Oral, Q12H, Kieth BrightlySeeplaputhur G Sankar, MD, 1 tablet at 08/13/15 1004 .  enoxaparin (LOVENOX) injection 40 mg, 40 mg, Subcutaneous, Q24H, Tiney Rougealph Ely III, MD, 40 mg at 08/12/15 2208 .  HYDROcodone-acetaminophen (NORCO/VICODIN) 5-325 MG per tablet 1-2 tablet, 1-2 tablet, Oral, Q4H PRN, Tiney Rougealph Ely III, MD, 1 tablet at 08/13/15 1355 .  HYDROmorphone (DILAUDID) injection 1 mg, 1 mg, Intravenous, Q2H PRN, Tiney Rougealph Ely III, MD, 1 mg at 08/11/15 1529 .  indomethacin (INDOCIN) 50 MG suppository 100 mg, 100 mg, Rectal, Once, Midge Miniumarren Wohl, MD .  ondansetron (ZOFRAN) tablet 4 mg, 4 mg, Oral, Q6H PRN **OR** ondansetron (ZOFRAN) injection 4 mg, 4 mg, Intravenous, Q6H PRN, Earline MayotteJeffrey W Byrnett, MD .  pantoprazole sodium (PROTONIX) 40 mg/20 mL oral suspension 40 mg, 40 mg, Oral, Daily, Tiney Rougealph Ely III, MD, 40 mg at 08/13/15 [id number]  Follwup: Follow-up Information    Follow [facility] with [facility] [place] In 3 days.   Specialty:  General Surgery   Contact information:   [address] [place] [id number] [phone]      Signed: Tiney RougeRalph Ely III 08/13/2015, 5:32 PM

## 2015-08-13 NOTE — Discharge Instructions (Signed)

## 2015-08-15 ENCOUNTER — Other Ambulatory Visit: Payer: Self-pay

## 2015-08-15 ENCOUNTER — Telehealth: Payer: Self-pay | Admitting: General Surgery

## 2015-08-15 NOTE — Telephone Encounter (Signed)
Needs fmla papers today

## 2015-08-16 ENCOUNTER — Ambulatory Visit (INDEPENDENT_AMBULATORY_CARE_PROVIDER_SITE_OTHER): Payer: BLUE CROSS/BLUE SHIELD | Admitting: General Surgery

## 2015-08-16 ENCOUNTER — Encounter: Payer: Self-pay | Admitting: General Surgery

## 2015-08-16 ENCOUNTER — Other Ambulatory Visit: Payer: BLUE CROSS/BLUE SHIELD

## 2015-08-16 ENCOUNTER — Encounter (INDEPENDENT_AMBULATORY_CARE_PROVIDER_SITE_OTHER): Payer: Self-pay

## 2015-08-16 VITALS — BP 135/90 | HR 98 | Temp 98.3°F | Ht 71.0 in | Wt 292.0 lb

## 2015-08-16 DIAGNOSIS — Z4889 Encounter for other specified surgical aftercare: Secondary | ICD-10-CM

## 2015-08-16 NOTE — Telephone Encounter (Signed)
FMLA and Disability Form was filled out and faxed on 08/16/2015. Patient was verbally informed when she came in to her post-Op appointment.

## 2015-08-16 NOTE — Progress Notes (Signed)
Outpatient Surgical Follow Up  08/16/2015  Brynda GreathouseLinzy T Savini is an 25 y.o. female.   Chief Complaint  Patient presents with  . Routine Post Op    Laparoscopic Cholecystectomy 08/09/2015 Dr. Michela PitcherEly    HPI: 25 year old female returns to clinic 1 week status post laparoscopic cholecystectomy. Postoperative course was compensated by a bile leak which required an ERCP with stent placement. Patient states that since going home she has continued to improve but continues to have malaise. She also gets full easily but denies any nausea or vomiting. Her pain is well-controlled and she has a drain in place with minimal drain output and desires to have it removed. She denies any fevers, chills, nausea, vomiting, chest pain, shortness of breath, diarrhea, the patient.  Past Medical History  Diagnosis Date  . Depression   . Polycystic disease, ovaries     Past Surgical History  Procedure Laterality Date  . Tonsillectomy and adenoidectomy    . Cholecystectomy N/A 08/09/2015    Procedure: LAPAROSCOPIC CHOLECYSTECTOMY WITH INTRAOPERATIVE CHOLANGIOGRAM;  Surgeon: Tiney Rougealph Ely III, MD;  Location: [facility] ORS;  Service: General;  Laterality: N/A;  . Ercp N/A 08/11/2015    Procedure: ENDOSCOPIC RETROGRADE CHOLANGIOPANCREATOGRAPHY (ERCP);  Surgeon: Midge Miniumarren Wohl, MD;  [facility];  Service: Endoscopy;  Laterality: N/A;    Family History  Problem Relation Age of Onset  . Diabetes Mother     Type 2  . Hypertension Mother   . Hyperlipidemia Mother   . Cancer Paternal Aunt     Ovarian  . Cancer Paternal Grandfather     Liver  . Hypertension Paternal Grandfather     Social History:  reports that she has been smoking.  She does not have any smokeless tobacco history on file. She reports that she drinks alcohol. She reports that she does not use illicit drugs.  Allergies: No Known Allergies  Medications reviewed.    ROS A multipoint review of systems was completed. All pertinent positives and  negatives are documented within the history of present illness and remainder are negative.   BP 135/90 mmHg  Pulse 98  Temp(Src) 98.3 F (36.8 C) (Oral)  Ht 5\' 11"  (1.803 m)  Wt 132.45 kg (292 lb)  BMI 40.74 kg/m2  Physical Exam : No acute distress Chest: Clear to auscultation Heart: Regular rhythm Having: Large, soft, appropriately tender to palpation of her incision sites, nondistended. JP drain present in the right upper quadrant with minimal serosanguineous drainage. No evidence of bilious output.    No results found for this or any previous visit (from the past 48 hour(s)). No results found.  Assessment/Plan:  1. Aftercare following surgery 25 year old female one week status post laparoscopic cholecystectomy. JP drain removed today in clinic without any difficulty. Discussed the signs and symptoms of infection and return to clinic immediately should they occur. Otherwise follow-up in clinic in 1 week tabs remainder of her sutures removed. All questions answered to the patient's satisfaction.     Ricarda Frameharles Laylani Pudwill, MD FACS General Surgeon  08/16/2015,2:20 PM

## 2015-08-16 NOTE — Patient Instructions (Signed)
Please call our office with any questions or concerns.  Please do not submerge in a tub, hot tub, or pool until incisions are completely sealed.  Use sun block to incision area over the next year if this area will be exposed to sun. This helps decrease scarring.  You may now resume your normal activities. Listen to your body when lifting, if you have pain when lifting, stop and then try again in a few days.  If you develop redness, drainage, or pain at incision sites- call our office immediately and speak with a nurse.  We will see you next week to remove your sutures.

## 2015-08-17 ENCOUNTER — Telehealth: Payer: Self-pay | Admitting: General Surgery

## 2015-08-17 MED ORDER — FLUCONAZOLE 150 MG PO TABS: 150.0000 mg | ORAL_TABLET | Freq: Every day | ORAL | Status: DC

## 2015-08-17 NOTE — Telephone Encounter (Signed)
Medication sent to patient's preferred pharmacy at this time.   Call made to patient at this time to explain medication has been sent. She is very Adult nurseappreciative.

## 2015-08-17 NOTE — Telephone Encounter (Signed)
Patient has called and stated that she feels like she may have a yeast infection due to her antibiotics that were given in the hospital--patient still on antibiotics at this time.   If a medication can be prescribed for this reason she would [facility]. Please contact patient.

## 2015-08-18 ENCOUNTER — Telehealth: Payer: Self-pay | Admitting: Surgery

## 2015-08-18 NOTE — Telephone Encounter (Signed)
Returned phone call to patient at this time. Patient denies drainage, fever, redness at site. I explained to patient that what she is describing in the drain tract is completely normal healing tissue. But, if she has any questions or concerns, she is to call the office immediately. She verbalizes understanding of this conversation.

## 2015-08-18 NOTE — Telephone Encounter (Signed)
Patient had LAPAROSCOPIC CHOLECYSTECTOMY  With Dr Michela PitcherEly on 6/6. She had her drain removed by Dr Tonita CongWoodham on Tuesday 6/13. She states there is now a puss colored spot in the hole where the tube was removed. She wants to speak with the nurse to make sure its not infected. Please call and advise.

## 2015-08-22 ENCOUNTER — Ambulatory Visit (INDEPENDENT_AMBULATORY_CARE_PROVIDER_SITE_OTHER): Payer: BLUE CROSS/BLUE SHIELD | Admitting: Surgery

## 2015-08-22 ENCOUNTER — Encounter: Payer: Self-pay | Admitting: Physician Assistant

## 2015-08-22 ENCOUNTER — Encounter: Payer: Self-pay | Admitting: Surgery

## 2015-08-22 ENCOUNTER — Other Ambulatory Visit
Admission: RE | Admit: 2015-08-22 | Discharge: 2015-08-22 | Disposition: A | Discharge status: Home / Self Care | Payer: BLUE CROSS/BLUE SHIELD | Source: Ambulatory Visit | Attending: Surgery | Admitting: Surgery

## 2015-08-22 ENCOUNTER — Ambulatory Visit (INDEPENDENT_AMBULATORY_CARE_PROVIDER_SITE_OTHER): Payer: BLUE CROSS/BLUE SHIELD | Admitting: Physician Assistant

## 2015-08-22 ENCOUNTER — Telehealth: Payer: Self-pay

## 2015-08-22 VITALS — BP 112/80 | HR 113 | Temp 98.4°F | Resp 16 | Wt 284.4 lb

## 2015-08-22 VITALS — BP 107/76 | HR 106 | Temp 97.9°F | Ht 71.0 in | Wt 284.2 lb

## 2015-08-22 DIAGNOSIS — Z6839 Body mass index (BMI) 39.0-39.9, adult: Secondary | ICD-10-CM

## 2015-08-22 DIAGNOSIS — K8020 Calculus of gallbladder without cholecystitis without obstruction: Secondary | ICD-10-CM | POA: Diagnosis not present

## 2015-08-22 DIAGNOSIS — E669 Obesity, unspecified: Secondary | ICD-10-CM | POA: Diagnosis not present

## 2015-08-22 DIAGNOSIS — Z713 Dietary counseling and surveillance: Secondary | ICD-10-CM

## 2015-08-22 LAB — COMPREHENSIVE METABOLIC PANEL
ALBUMIN: 3.8 g/dL (ref 3.5–5.0)
ALK PHOS: 26 U/L — AB (ref 38–126)
ALT: 42 U/L (ref 14–54)
AST: 32 U/L (ref 15–41)
Anion gap: 10 (ref 5–15)
BILIRUBIN TOTAL: 0.2 mg/dL — AB (ref 0.3–1.2)
BUN: 8 mg/dL (ref 6–20)
CALCIUM: 9.6 mg/dL (ref 8.9–10.3)
CO2: 22 mmol/L (ref 22–32)
Chloride: 106 mmol/L (ref 101–111)
Creatinine, Ser: 0.75 mg/dL (ref 0.44–1.00)
GFR calc Af Amer: >60 mL/min (ref >60)
GLUCOSE: 120 mg/dL — AB (ref 65–99)
Potassium: 3.8 mmol/L (ref 3.5–5.1)
Sodium: 138 mmol/L (ref 135–145)
TOTAL PROTEIN: 8.1 g/dL (ref 6.5–8.1)

## 2015-08-22 LAB — CBC WITH DIFFERENTIAL/PLATELET
BASOS ABS: 0.1 10*3/uL (ref 0–0.1)
BASOS PCT: 1 %
EOS ABS: 0.2 10*3/uL (ref 0–0.7)
EOS PCT: 3 %
HCT: 37.5 % (ref 35.0–47.0)
Hemoglobin: 12.1 g/dL (ref 12.0–16.0)
Lymphocytes Relative: 23 %
Lymphs Abs: 1.8 10*3/uL (ref 1.0–3.6)
MCH: 24.9 pg — ABNORMAL LOW (ref 26.0–34.0)
MCHC: 32.3 g/dL (ref 32.0–36.0)
MCV: 77.2 fL — ABNORMAL LOW (ref 80.0–100.0)
MONO ABS: 0.5 10*3/uL (ref 0.2–0.9)
Monocytes Relative: 7 %
Neutro Abs: 5.1 10*3/uL (ref 1.4–6.5)
Neutrophils Relative %: 66 %
PLATELETS: 370 10*3/uL (ref 150–440)
RBC: 4.85 MIL/uL (ref 3.80–5.20)
RDW: 15.5 % — AB (ref 11.5–14.5)
WBC: 7.7 10*3/uL (ref 3.6–11.0)

## 2015-08-22 MED ORDER — PHENTERMINE HCL 15 MG PO CAPS: 15.0000 mg | ORAL_CAPSULE | ORAL | Status: DC

## 2015-08-22 NOTE — Telephone Encounter (Signed)
Called patient to let her know CBC and CMP are normal. Patient verbalized understanding.

## 2015-08-22 NOTE — Patient Instructions (Signed)
Patient's mother disability form was filled out and mailed.

## 2015-08-22 NOTE — Patient Instructions (Signed)
We need for you to go to the lab today. We will call you later today with the results. Please call our office if you have any questions.

## 2015-08-22 NOTE — Patient Instructions (Signed)

## 2015-08-22 NOTE — Progress Notes (Signed)
Outpatient postop visit  08/22/2015  Jill GreathouseLinzy T Moon is an 25 y.o. female.    Procedure:lap chole, postop ERCP, stent  CC:RUQ pain  HPI: This patient with a history of laparoscopic cholecystectomy who had elevated liver function tests postoperatively and a postoperative ERCP demonstrated stones and a small duct of Luschka leak Weakness and some right upper quadrant pain her drain is been removed previously she has mild nausea no emesis is not asking for additional pain medication and is eating well with normal bowel movements. She does feel weak however  Medications reviewed.    Physical Exam:  BP 107/76 mmHg  Pulse 106  Temp(Src) 97.9 F (36.6 C) (Oral)  Ht 5\' 11"  (1.803 m)  Wt 284 lb 3.2 oz (128.912 kg)  BMI 39.66 kg/m2  LMP 08/13/2015    PE: No icterus no jaundice Patient in no distress and appears comfortable Abdomen is soft nontender nondistended nontympanitic wounds are clean with no erythema or drainage sutures are removed Nontender calves    Assessment/Plan:  Patient who experienced a retained stone and a duct of Luschka leak. Will obtain liver function tests today as she has a stent in place. If there is a problem with the stent then she may require expedited referral back to Dr. Servando SnareWohl for stent change or removal otherwise a normal lab tests today would indicate that there would be no further sign of leak or retained stone. This is discussed with the patient she did not ask for any additional pain medication  Lattie Hawichard E Rosell Khouri, MD, FACS

## 2015-08-22 NOTE — Progress Notes (Signed)
Patient: Jill Moon Female    DOB: Aug 27, 1990   25 y.o.   MRN: [id number] Visit Date: 08/22/2015  Today's Provider: Margaretann Loveless, PA-C   Chief Complaint  Patient presents with  . Follow-up    weight   Subjective:    HPI Patient is here to follow up on weight loss counseling. She is on phentermine . She reports hat she has been eating healthy. She report that she being walking for 20 minutes three times a week.  Patient also went to ED on 06/06 and got admitted for acute cholecystitis with cholelithiasis. She had a cholecystectomy 08/11/15 by Dr. Michela Pitcher. She had post-op complications of elevated LFTs and was found to have a stone lodged in a duct. She underwent ERCP and stent placement by Dr. Servando Snare. She had her f/u with Dr. Excell Seltzer today and they were rechecking her LFTs. Will f/u pending these results on what is to be done for the stent. Sutures removed today without complication. She follows up with Dr. Servando Snare in Sept for stent removal if all goes well. She is still on restrictions post operatively so she is unable to exercise too much, but has been continuing to walk.    No Known Allergies Current Meds  Medication Sig  . amoxicillin-clavulanate (AUGMENTIN) 875-125 MG tablet Take 1 tablet by mouth every 12 (twelve) hours.  Marland Kitchen escitalopram (LEXAPRO) 10 MG tablet Take 1 tablet (10 mg total) by mouth at bedtime.  . fluconazole (DIFLUCAN) 150 MG tablet Take 1 tablet (150 mg total) by mouth daily.  Marland Kitchen HYDROcodone-acetaminophen (NORCO/VICODIN) 5-325 MG tablet Take 1-2 tablets by mouth every 4 (four) hours as needed for moderate pain.  . metFORMIN (GLUCOPHAGE) 500 MG tablet Take 1 tablet (500 mg total) by mouth daily with breakfast.  . phentermine 15 MG capsule Take 15 mg by mouth every morning.  Marland Kitchen RECLIPSEN 0.15-30 MG-MCG tablet Take 1 tablet by mouth daily.     Review of Systems  Respiratory: Negative.   Cardiovascular: Negative.  Negative for chest pain, palpitations and  leg swelling.  Gastrointestinal: Negative.   Psychiatric/Behavioral: Negative.     Social History  Substance Use Topics  . Smoking status: Light Tobacco Smoker  . Smokeless tobacco: Not on file  . Alcohol Use: Yes     Comment: occasional   Objective:   BP 112/80 mmHg  Pulse 113  Temp(Src) 98.4 F (36.9 C) (Oral)  Resp 16  Wt 284 lb 6.4 oz (129.003 kg)  LMP 08/13/2015  Physical Exam  Constitutional: She appears well-developed and well-nourished. No distress.  Neck: Normal range of motion. Neck supple.  Cardiovascular: Normal rate, regular rhythm and normal heart sounds.  Exam reveals no gallop and no friction rub.   No murmur heard. Pulmonary/Chest: Effort normal and breath sounds normal. No respiratory distress. She has no wheezes. She has no rales.  Abdominal:  Sutures removed today. All incisions are healing well.  Skin: She is not diaphoretic.  Vitals reviewed.       Assessment & Plan:     1. Encounter for weight loss counseling Will continue phentermine and metformin for weight loss. Advance activity as allowed. Continue to limit caloric intake. I will see her back in 5-6 weeks for a weight recheck. She is to call if she has any acute issue in the meantime.  - phentermine 15 MG capsule; Take 1 capsule (15 mg total) by mouth every morning.  Dispense: 30 capsule; Refill: 0  2.  Obese See above medical treatment plan. - phentermine 15 MG capsule; Take 1 capsule (15 mg total) by mouth every morning.  Dispense: 30 capsule; Refill: 0  3. BMI 39.0-39.9,adult See above medical treatment plan. - phentermine 15 MG capsule; Take 1 capsule (15 mg total) by mouth every morning.  Dispense: 30 capsule; Refill: 0 '      Margaretann LovelessJennifer M Jaime Dome, PA-C  [facility]

## 2015-08-23 ENCOUNTER — Ambulatory Visit: Admission: RE | Admit: 2015-08-23 | Payer: BLUE CROSS/BLUE SHIELD | Source: Ambulatory Visit | Admitting: Surgery

## 2015-08-23 DIAGNOSIS — R87612 Low grade squamous intraepithelial lesion on cytologic smear of cervix (LGSIL): Secondary | ICD-10-CM | POA: Diagnosis not present

## 2015-08-23 DIAGNOSIS — Z01419 Encounter for gynecological examination (general) (routine) without abnormal findings: Secondary | ICD-10-CM | POA: Diagnosis not present

## 2015-08-23 DIAGNOSIS — Z113 Encounter for screening for infections with a predominantly sexual mode of transmission: Secondary | ICD-10-CM | POA: Diagnosis not present

## 2015-08-23 DIAGNOSIS — Z742 Need for assistance at home and no other household member able to render care: Secondary | ICD-10-CM | POA: Diagnosis not present

## 2015-08-23 DIAGNOSIS — Z8041 Family history of malignant neoplasm of ovary: Secondary | ICD-10-CM | POA: Diagnosis not present

## 2015-08-23 DIAGNOSIS — Z124 Encounter for screening for malignant neoplasm of cervix: Secondary | ICD-10-CM | POA: Diagnosis not present

## 2015-08-23 DIAGNOSIS — Z309 Encounter for contraceptive management, unspecified: Secondary | ICD-10-CM | POA: Diagnosis not present

## 2015-08-24 ENCOUNTER — Telehealth: Payer: Self-pay | Admitting: Surgery

## 2015-08-24 ENCOUNTER — Ambulatory Visit: Payer: BLUE CROSS/BLUE SHIELD | Admitting: Physician Assistant

## 2015-08-24 NOTE — Telephone Encounter (Signed)
Patient would like a call back to discuss some information that was missing on her mother's FMLA forms. Please contact patient back so that she can discuss what may need to be added to the forms. Phone number verified.

## 2015-08-24 NOTE — Telephone Encounter (Signed)
I have faxed the disability letter that is in the patient's chart to [employer] @ [phone] with case number [id number] per the patient's request.

## 2015-08-24 NOTE — Telephone Encounter (Signed)
Called patient back and she stated that she wanted certain things put in her mother's disability form on Section # 4 asking for us to explain the care needed by the patient and why such care is medically necessary. I told her what I had written on her disability form. Patient agreed with what was written. I told her that if she needed anything else from us, to please give us a call.

## 2015-08-25 LAB — HM PAP SMEAR: HM PAP: NEGATIVE

## 2015-08-31 ENCOUNTER — Ambulatory Visit: Payer: BLUE CROSS/BLUE SHIELD | Admitting: Physician Assistant

## 2015-10-03 ENCOUNTER — Encounter: Payer: Self-pay | Admitting: Physician Assistant

## 2015-10-03 ENCOUNTER — Ambulatory Visit (INDEPENDENT_AMBULATORY_CARE_PROVIDER_SITE_OTHER): Payer: BLUE CROSS/BLUE SHIELD | Admitting: Physician Assistant

## 2015-10-03 VITALS — BP 110/80 | HR 85 | Temp 98.6°F | Resp 16 | Wt 285.6 lb

## 2015-10-03 DIAGNOSIS — F32A Depression, unspecified: Secondary | ICD-10-CM

## 2015-10-03 DIAGNOSIS — Z713 Dietary counseling and surveillance: Secondary | ICD-10-CM

## 2015-10-03 DIAGNOSIS — F329 Major depressive disorder, single episode, unspecified: Secondary | ICD-10-CM

## 2015-10-03 DIAGNOSIS — E669 Obesity, unspecified: Secondary | ICD-10-CM | POA: Diagnosis not present

## 2015-10-03 DIAGNOSIS — E282 Polycystic ovarian syndrome: Secondary | ICD-10-CM | POA: Diagnosis not present

## 2015-10-03 DIAGNOSIS — Z6839 Body mass index (BMI) 39.0-39.9, adult: Secondary | ICD-10-CM | POA: Diagnosis not present

## 2015-10-03 MED ORDER — PHENTERMINE HCL 15 MG PO CAPS: 15.0000 mg | ORAL_CAPSULE | ORAL | 0 refills | Status: DC

## 2015-10-03 MED ORDER — BUPROPION HCL ER (SR) 150 MG PO TB12: 150.0000 mg | ORAL_TABLET | Freq: Two times a day (BID) | ORAL | 0 refills | Status: DC

## 2015-10-03 MED ORDER — METFORMIN HCL 500 MG PO TABS: 500.0000 mg | ORAL_TABLET | Freq: Every day | ORAL | 6 refills | Status: DC

## 2015-10-03 NOTE — Progress Notes (Signed)
Patient: Jill Moon Female    DOB: Jan 06, 1991   24 y.o.   MRN: [id number] Visit Date: 10/03/2015  Today's Provider: Margaretann Loveless, PA-C   Chief Complaint  Patient presents with  . Follow-up    Weight Loss Counseling   Subjective:    HPI Patient is here today to follow-up on Obesity. She is taking her Phentermine and Metformin. She reports that for the last past three weeks she has been eating and not exercising. She reports she just went through a break up. Last office visit she was 284.4 lbs and today her weight is 285.6 lbs. She also has stopped her lexapro because it "made me feel numb." She is interested in starting something else for her depression.    No Known Allergies Current Meds  Medication Sig  . phentermine 15 MG capsule Take 1 capsule (15 mg total) by mouth every morning.  Marland Kitchen RECLIPSEN 0.15-30 MG-MCG tablet Take 1 tablet by mouth daily.     Review of Systems  Constitutional: Negative.   Respiratory: Negative.   Cardiovascular: Negative.   Neurological: Negative.   Psychiatric/Behavioral: Positive for dysphoric mood. Negative for agitation, behavioral problems, decreased concentration, self-injury, sleep disturbance and suicidal ideas. The patient is not nervous/anxious.     Social History  Substance Use Topics  . Smoking status: Light Tobacco Smoker  . Smokeless tobacco: Never Used  . Alcohol use Yes     Comment: occasional   Objective:   BP 110/80 (BP Location: Left Arm, Patient Position: Sitting, Cuff Size: Large)   Pulse 85   Temp 98.6 F (37 C) (Oral)   Resp 16   Wt 285 lb 9.6 oz (129.5 kg)   LMP 09/23/2015   BMI 39.83 kg/m   Physical Exam  Constitutional: She appears well-developed and well-nourished. No distress.  Cardiovascular: Normal rate, regular rhythm and normal heart sounds.  Exam reveals no gallop and no friction rub.   No murmur heard. Pulmonary/Chest: Effort normal and breath sounds normal. No respiratory distress.  She has no wheezes. She has no rales.  Skin: She is not diaphoretic.  Psychiatric: She has a normal mood and affect. Her behavior is normal. Judgment and thought content normal.  Vitals reviewed.     Assessment & Plan:     1. Depression She d/c lexapro on her own because it made her feel numb. We will start wellbutrin as below and f/u in 4 weeks. I am hoping wellbutrin will also give some weight loss benefit for her as well as control her mood.  - buPROPion (WELLBUTRIN SR) 150 MG 12 hr tablet; Take 1 tablet (150 mg total) by mouth 2 (two) times daily.  Dispense: 60 tablet; Refill: 0  2. Encounter for weight loss counseling She did not do well this past month, but had some emotional hurdles with a break-up with her long-time boyfriend as well as stopping her lexapro. Discussed importance of lifestyle modification and trying to better handle her stress with healthier options instead of turning to food. She voiced understanding. Will give one more month of phentermine as below and will follow up in 4 weeks. If she still does not lose weight or if she gains again I will stop the medication and try nutrition counseling and have her work on lifestyle modifications at home, then reconsider restarting phentermine if she can show me she can lose weight on her own.  - phentermine 15 MG capsule; Take 1 capsule (15 mg total)  by mouth every morning.  Dispense: 30 capsule; Refill: 0  3. Obese See above medical treatment plan. - phentermine 15 MG capsule; Take 1 capsule (15 mg total) by mouth every morning.  Dispense: 30 capsule; Refill: 0  4. BMI 39.0-39.9,adult See above medical treatment plan. - phentermine 15 MG capsule; Take 1 capsule (15 mg total) by mouth every morning.  Dispense: 30 capsule; Refill: 0  5. PCOS (polycystic ovarian syndrome) Doing well with metformin for PCOS. Will continue as below. - metFORMIN (GLUCOPHAGE) 500 MG tablet; Take 1 tablet (500 mg total) by mouth daily with breakfast.   Dispense: 30 tablet; Refill: 6       Margaretann Loveless, PA-C  [facility]

## 2015-10-03 NOTE — Patient Instructions (Signed)

## 2015-10-11 ENCOUNTER — Telehealth: Payer: Self-pay | Admitting: Gastroenterology

## 2015-10-11 NOTE — Telephone Encounter (Signed)
Please call patient. She has a procedure with Dr Servando SnareWohl on 9/5. She is unable to get that day off from work and would like to change the day.

## 2015-10-12 NOTE — Telephone Encounter (Signed)
Tried contacted pt but number on file had been disconnected. Sent pt an email to contact me to change.

## 2015-10-12 NOTE — Telephone Encounter (Signed)
Contacted pt's father on emergency contact and requested him to have pt to call me back.

## 2015-10-13 NOTE — Telephone Encounter (Signed)
Ginger please call the patient around 8:00. She can't talk after 8:30. She needs to reschedule her ERCP

## 2015-10-17 NOTE — Telephone Encounter (Signed)
Left vm letting pt know we have cancelled per her request the ERCP on 11/08/15. Gave her some dates in October to pick.

## 2015-10-19 NOTE — Telephone Encounter (Signed)
Spoke with pt and offered Tuesday, Sept [facility]th at [facility]. Will speak with her Manager and call back to let me know if this is okay. Other day would be 12/13/15. Advised pt this stent needs to be removed due to the risk of infection. Pt verbalized understanding.

## 2015-10-20 ENCOUNTER — Telehealth: Payer: Self-pay | Admitting: Gastroenterology

## 2015-10-20 ENCOUNTER — Other Ambulatory Visit: Payer: Self-pay

## 2015-10-20 NOTE — Telephone Encounter (Signed)
Pt called stated 11/29/15 will be okay. Orders for ERCP stent removal has been placed and instructions have been mailed to pt.

## 2015-10-20 NOTE — Telephone Encounter (Signed)
Patient called and said the 26th was ok for have stint removed and would like for you to call her back.

## 2015-11-08 ENCOUNTER — Ambulatory Visit
Admission: RE | Admit: 2015-11-08 | Payer: BLUE CROSS/BLUE SHIELD | Source: Ambulatory Visit | Admitting: Gastroenterology

## 2015-11-08 ENCOUNTER — Encounter: Admission: RE | Payer: Self-pay | Source: Ambulatory Visit

## 2015-11-08 SURGERY — ERCP, WITH INTERVENTION IF INDICATED
Anesthesia: General

## 2015-11-10 ENCOUNTER — Ambulatory Visit: Payer: BLUE CROSS/BLUE SHIELD | Admitting: Physician Assistant

## 2015-11-15 ENCOUNTER — Encounter: Payer: Self-pay | Admitting: Physician Assistant

## 2015-11-15 ENCOUNTER — Ambulatory Visit (INDEPENDENT_AMBULATORY_CARE_PROVIDER_SITE_OTHER): Payer: BLUE CROSS/BLUE SHIELD | Admitting: Physician Assistant

## 2015-11-15 VITALS — BP 100/62 | HR 80 | Temp 98.3°F | Resp 16 | Wt 290.2 lb

## 2015-11-15 DIAGNOSIS — F32A Depression, unspecified: Secondary | ICD-10-CM

## 2015-11-15 DIAGNOSIS — F329 Major depressive disorder, single episode, unspecified: Secondary | ICD-10-CM

## 2015-11-15 MED ORDER — VORTIOXETINE HBR 10 MG PO TABS: 1.0000 | ORAL_TABLET | Freq: Every day | ORAL | 0 refills | Status: DC

## 2015-11-15 MED ORDER — VORTIOXETINE HBR 10 MG PO TABS: 1.0000 | ORAL_TABLET | Freq: Every day | ORAL | 1 refills | Status: DC

## 2015-11-15 NOTE — Progress Notes (Signed)
Patient: Jill Moon Female    DOB: Jan 02, 1991   25 y.o.   MRN: [id number] Visit Date: 11/15/2015  Today's Provider: Margaretann Loveless, PA-C   Chief Complaint  Patient presents with  . Follow-up    Depression and Weight   Subjective:    HPI Patient is here for follow-up weight. She reports that she is not exercising and that she is not eating healthy. She reports eating is like her comfort right now. She would like to work on her depression first then will reconsider starting weight loss back.  Depression: Patient is here for follow-up. She was started on Wellbutrin last office visit. She feels it works a little, but is having increasing irritability. She is having crying spells, feels irritable, very bad mood swings, gets mad really easily. Reports she feels agitated and has decrease concentration. Reports that at the end of the day she doesn't have a lot of energy and just doesn't want to do anything. No suicidal thoughts. She reports sleeping well, "a lot". She has previously tried Zoloft (discontinue due to ineffective and weight gain) and lexapro (made her not have any emotions, flat). She does report her mother was recently started on Trintellix and is doing well. She is interested in trying this medication.    No Known Allergies   Current Outpatient Prescriptions:  .  buPROPion (WELLBUTRIN SR) 150 MG 12 hr tablet, Take 1 tablet (150 mg total) by mouth 2 (two) times daily., Disp: 60 tablet, Rfl: 0 .  metFORMIN (GLUCOPHAGE) 500 MG tablet, Take 1 tablet (500 mg total) by mouth daily with breakfast., Disp: 30 tablet, Rfl: 6 .  phentermine 15 MG capsule, Take 1 capsule (15 mg total) by mouth every morning., Disp: 30 capsule, Rfl: 0 .  RECLIPSEN 0.15-30 MG-MCG tablet, Take 1 tablet by mouth daily. , Disp: , Rfl: 8  Review of Systems  Constitutional: Positive for appetite change and fatigue.  Respiratory: Negative for cough, chest tightness and shortness of breath.     Cardiovascular: Negative for chest pain, palpitations and leg swelling.  Gastrointestinal: Negative for abdominal pain.  Neurological: Negative for dizziness and headaches.  Psychiatric/Behavioral: Positive for agitation, decreased concentration and dysphoric mood. Negative for self-injury, sleep disturbance and suicidal ideas. The patient is nervous/anxious.     Social History  Substance Use Topics  . Smoking status: Light Tobacco Smoker  . Smokeless tobacco: Never Used  . Alcohol use Yes     Comment: occasional   Objective:   BP 100/62 (BP Location: Left Arm, Patient Position: Sitting, Cuff Size: Large)   Pulse 80   Temp 98.3 F (36.8 C) (Oral)   Resp 16   Wt 290 lb 3.2 oz (131.6 kg)   BMI 40.47 kg/m   Physical Exam  Constitutional: She appears well-developed and well-nourished. No distress.  Cardiovascular: Normal rate, regular rhythm and normal heart sounds.  Exam reveals no gallop and no friction rub.   No murmur heard. Pulmonary/Chest: Effort normal and breath sounds normal. No respiratory distress. She has no wheezes. She has no rales.  Skin: She is not diaphoretic.  Psychiatric: She has a normal mood and affect. Her behavior is normal. Judgment and thought content normal.  Vitals reviewed.      Assessment & Plan:     1. Depression I have given her 2 weeks worth of samples to try as well as sent in new Rx to local pharmacy so that we can start the prior  auth process. I am hopeful she may see benefit with this medication as her mother has also done well with this. I will see her back in 6 weeks or so to see how she is doing with this medication. She is to call the office if she does not tolerate the medication or if it is ineffective.  - vortioxetine HBr (TRINTELLIX) 10 MG TABS; Take 1 tablet (10 mg total) by mouth daily.  Dispense: 7 tablet; Refill: 1 - vortioxetine HBr (TRINTELLIX) 10 MG TABS; Take 1 tablet (10 mg total) by mouth daily.  Dispense: 30 tablet; Refill:  0       Margaretann LovelessJennifer M Maleeka Sabatino, PA-C  [facility]

## 2015-11-15 NOTE — Patient Instructions (Signed)
Vortioxetine oral tablet What is this medicine? Vortioxetine (vor tee OX e teen) is used to treat depression. This medicine may be used for other purposes; ask your health care provider or pharmacist if you have questions. What should I tell my health care provider before I take this medicine? They need to know if you have any of these conditions: -bipolar disorder or a family history of bipolar disorder -bleeding disorders -drink alcohol -glaucoma -liver disease -low levels of sodium in the blood -seizures -suicidal thoughts, plans, or attempt; a previous suicide attempt by you or a family member -take medicines that treat or prevent blood clots -an unusual or allergic reaction to vortioxetine, other medicines, foods, dyes, or preservatives -pregnant or trying to get pregnant -breast-feeding How should I use this medicine? Take this medicine by mouth with a glass of water. Follow the directions on the prescription label. You can take it with or without food. If it upsets your stomach, take it with food. Take your medicine at regular intervals. Do not take it more often than directed. Do not stop taking this medicine suddenly except upon the advice of your doctor. Stopping this medicine too quickly may cause serious side effects or your condition may worsen. A special MedGuide will be given to you by the pharmacist with each prescription and refill. Be sure to read this information carefully each time. Talk to your pediatrician regarding the use of this medicine in children. Special care may be needed. Overdosage: If you think you have taken too much of this medicine contact a poison control center or emergency room at once. NOTE: This medicine is only for you. Do not share this medicine with others. What if I miss a dose? If you miss a dose, take it as soon as you can. If it is almost time for your next dose, take only that dose. Do not take double or extra doses. What may interact with  this medicine? Do not take this medicine with any of the following medications: -linezolid -MAOIs like Carbex, Eldepryl, Marplan, Nardil, and Parnate -methylene blue (injected into a vein) This medicine may also interact with the following medications: -alcohol -aspirin and aspirin-like medicines -carbamazepine -certain medicines for depression, anxiety, or psychotic disturbances -certain medicines for migraine headache like almotriptan, eletriptan, frovatriptan, naratriptan, rizatriptan, sumatriptan, zolmitriptan -diuretics -fentanyl -furazolidone -isoniazid -medicines that treat or prevent blood clots like warfarin, enoxaparin, and dalteparin -NSAIDs, medicines for pain and inflammation, like ibuprofen or naproxen -phenytoin -procarbazine -quinidine -rasagiline -rifampin -supplements like St. John's wort, kava kava, valerian -tramadol -tryptophan This list may not describe all possible interactions. Give your health care provider a list of all the medicines, herbs, non-prescription drugs, or dietary supplements you use. Also tell them if you smoke, drink alcohol, or use illegal drugs. Some items may interact with your medicine. What should I watch for while using this medicine? Tell your doctor if your symptoms do not get better or if they get worse. Visit your doctor or health care professional for regular checks on your progress. Because it may take several weeks to see the full effects of this medicine, it is important to continue your treatment as prescribed by your doctor. Patients and their families should watch out for new or worsening thoughts of suicide or depression. Also watch out for sudden changes in feelings such as feeling anxious, agitated, panicky, irritable, hostile, aggressive, impulsive, severely restless, overly excited and hyperactive, or not being able to sleep. If this happens, especially at the beginning of   treatment or after a change in dose, call your health  care professional. You may get drowsy or dizzy. Do not drive, use machinery, or do anything that needs mental alertness until you know how this medicine affects you. Do not stand or sit up quickly, especially if you are an older patient. This reduces the risk of dizzy or fainting spells. Alcohol may interfere with the effect of this medicine. Avoid alcoholic drinks. Your mouth may get dry. Chewing sugarless gum or sucking hard candy, and drinking plenty of water may help. Contact your doctor if the problem does not go away or is severe. What side effects may I notice from receiving this medicine? Side effects that you should report to your doctor or health care professional as soon as possible: -allergic reactions like skin rash, itching or hives, swelling of the face, lips, or tongue -confusion -fast talking and excited feelings or actions that are out of control -feeling faint or lightheaded, falls -hallucination, loss of contact with reality -seizures -suicidal thoughts or other mood changes -unusual bleeding or bruising -unusual body movements or muscle twitching -weakness Side effects that usually do not require medical attention (Report these to your doctor or health care professional if they continue or are bothersome.): -change in sex drive or performance -constipation -diarrhea -dizziness -dry mouth -nausea This list may not describe all possible side effects. Call your doctor for medical advice about side effects. You may report side effects to FDA at 1-800-FDA-1088. Where should I keep my medicine? Keep out of the reach of children. Store at room temperature between 15 and 30 degrees C (59 and 86 degrees F). Throw away any unused medicine after the expiration date. NOTE: This sheet is a summary. It may not cover all possible information. If you have questions about this medicine, talk to your doctor, pharmacist, or health care provider.    2016, Elsevier/Gold Standard.  (2012-09-12 13:22:25)  

## 2015-11-29 ENCOUNTER — Ambulatory Visit: Payer: BLUE CROSS/BLUE SHIELD | Admitting: Anesthesiology

## 2015-11-29 ENCOUNTER — Encounter: Payer: Self-pay | Admitting: *Deleted

## 2015-11-29 ENCOUNTER — Ambulatory Visit
Admission: RE | Admit: 2015-11-29 | Discharge: 2015-11-29 | Disposition: A | Discharge status: Home / Self Care | Payer: BLUE CROSS/BLUE SHIELD | Source: Ambulatory Visit | Attending: Gastroenterology | Admitting: Gastroenterology

## 2015-11-29 ENCOUNTER — Encounter
Admission: RE | Disposition: A | Discharge status: Home / Self Care | Payer: Self-pay | Source: Ambulatory Visit | Attending: Gastroenterology

## 2015-11-29 DIAGNOSIS — Z800 Family history of malignant neoplasm of digestive organs: Secondary | ICD-10-CM | POA: Insufficient documentation

## 2015-11-29 DIAGNOSIS — E282 Polycystic ovarian syndrome: Secondary | ICD-10-CM | POA: Insufficient documentation

## 2015-11-29 DIAGNOSIS — F329 Major depressive disorder, single episode, unspecified: Secondary | ICD-10-CM | POA: Insufficient documentation

## 2015-11-29 DIAGNOSIS — F17210 Nicotine dependence, cigarettes, uncomplicated: Secondary | ICD-10-CM | POA: Insufficient documentation

## 2015-11-29 DIAGNOSIS — K8050 Calculus of bile duct without cholangitis or cholecystitis without obstruction: Secondary | ICD-10-CM | POA: Insufficient documentation

## 2015-11-29 DIAGNOSIS — Z7984 Long term (current) use of oral hypoglycemic drugs: Secondary | ICD-10-CM | POA: Insufficient documentation

## 2015-11-29 DIAGNOSIS — Z4659 Encounter for fitting and adjustment of other gastrointestinal appliance and device: Secondary | ICD-10-CM | POA: Diagnosis not present

## 2015-11-29 DIAGNOSIS — Z6841 Body Mass Index (BMI) 40.0 and over, adult: Secondary | ICD-10-CM | POA: Diagnosis not present

## 2015-11-29 DIAGNOSIS — E669 Obesity, unspecified: Secondary | ICD-10-CM | POA: Diagnosis not present

## 2015-11-29 HISTORY — PX: ERCP: SHX5425

## 2015-11-29 LAB — POCT PREGNANCY, URINE: Preg Test, Ur: NEGATIVE

## 2015-11-29 SURGERY — ERCP, WITH INTERVENTION IF INDICATED
Anesthesia: General

## 2015-11-29 MED ORDER — SUCCINYLCHOLINE CHLORIDE 20 MG/ML IJ SOLN
INTRAMUSCULAR | Status: DC | PRN
Start: 2015-11-29 — End: 2015-11-29
  Administered 2015-11-29: 120 mg via INTRAVENOUS

## 2015-11-29 MED ORDER — FENTANYL CITRATE (PF) 100 MCG/2ML IJ SOLN: 25.0000 ug | INTRAMUSCULAR | Status: DC | PRN

## 2015-11-29 MED ORDER — LIDOCAINE HCL (CARDIAC) 20 MG/ML IV SOLN
INTRAVENOUS | Status: DC | PRN
  Administered 2015-11-29: 40 mg via INTRAVENOUS

## 2015-11-29 MED ORDER — ONDANSETRON HCL 4 MG/2ML IJ SOLN
4.0000 mg | Freq: Once | INTRAMUSCULAR | Status: DC | PRN
Start: 2015-11-29 — End: 2015-11-29

## 2015-11-29 MED ORDER — ONDANSETRON HCL 4 MG/2ML IJ SOLN: 4.0000 mg | Freq: Once | INTRAMUSCULAR | Status: DC | PRN

## 2015-11-29 MED ORDER — FENTANYL CITRATE (PF) 100 MCG/2ML IJ SOLN
INTRAMUSCULAR | Status: DC | PRN
  Administered 2015-11-29 (×2): 50 ug via INTRAVENOUS

## 2015-11-29 MED ORDER — ONDANSETRON HCL 4 MG/2ML IJ SOLN
INTRAMUSCULAR | Status: DC | PRN
  Administered 2015-11-29: 4 mg via INTRAVENOUS

## 2015-11-29 MED ORDER — MIDAZOLAM HCL 2 MG/2ML IJ SOLN
INTRAMUSCULAR | Status: DC | PRN
  Administered 2015-11-29: 2 mg via INTRAVENOUS

## 2015-11-29 MED ORDER — SODIUM CHLORIDE 0.9 % IV SOLN
INTRAVENOUS | Status: DC
  Administered 2015-11-29: 12:00:00 via INTRAVENOUS

## 2015-11-29 MED ORDER — ROCURONIUM BROMIDE 100 MG/10ML IV SOLN
INTRAVENOUS | Status: DC | PRN
Start: 2015-11-29 — End: 2015-11-29
  Administered 2015-11-29: 5 mg via INTRAVENOUS

## 2015-11-29 MED ORDER — PROPOFOL 10 MG/ML IV BOLUS
INTRAVENOUS | Status: DC | PRN
Start: 2015-11-29 — End: 2015-11-29
  Administered 2015-11-29: 150 mg via INTRAVENOUS

## 2015-11-29 NOTE — H&P (Signed)
Midge Minium, MD [facility] [address], [address] [place], [place] [address] Phone: [phone] Fax : [phone]  Primary Care Physician:  Margaretann Loveless, PA-C Primary Gastroenterologist:  Dr. Servando Snare  Pre-Procedure History & Physical: HPI:  Jill Moon is a 25 y.o. female is here for an ERCP.   Past Medical History:  Diagnosis Date  . Depression   . Polycystic disease, ovaries     Past Surgical History:  Procedure Laterality Date  . CHOLECYSTECTOMY N/A 08/09/2015   Procedure: LAPAROSCOPIC CHOLECYSTECTOMY WITH INTRAOPERATIVE CHOLANGIOGRAM;  Surgeon: Tiney Rouge III, MD;  Location: [facility] ORS;  Service: General;  Laterality: N/A;  . ERCP N/A 08/11/2015   Procedure: ENDOSCOPIC RETROGRADE CHOLANGIOPANCREATOGRAPHY (ERCP);  Surgeon: Midge Minium, MD;  Location: [facility] ENDOSCOPY;  Service: Endoscopy;  Laterality: N/A;  . TONSILLECTOMY AND ADENOIDECTOMY      Prior to Admission medications   Medication Sig Start Date End Date Taking? Authorizing Provider  metFORMIN (GLUCOPHAGE) 500 MG tablet Take 1 tablet (500 mg total) by mouth daily with breakfast. 10/03/15  Yes Alessandra Bevels Burnette, PA-C  RECLIPSEN 0.15-30 MG-MCG tablet Take 1 tablet by mouth daily.  05/02/15  Yes Historical Provider, MD  vortioxetine HBr (TRINTELLIX) 10 MG TABS Take 1 tablet (10 mg total) by mouth daily. 11/15/15  Yes Alessandra Bevels Burnette, PA-C  vortioxetine HBr (TRINTELLIX) 10 MG TABS Take 1 tablet (10 mg total) by mouth daily. 11/15/15  Yes Margaretann Loveless, PA-C    Allergies as of 10/20/2015  . (No Known Allergies)    Family History  Problem Relation Age of Onset  . Diabetes Mother     Type 2  . Hypertension Mother   . Hyperlipidemia Mother   . Cancer Paternal Aunt     Ovarian  . Cancer Paternal Grandfather     Liver  . Hypertension Paternal Grandfather     Social History   Social History  . Marital status: Single    Spouse name: N/A  . Number of children: N/A  . Years of education: N/A    Occupational History  . Not on file.   Social History Main Topics  . Smoking status: Light Tobacco Smoker  . Smokeless tobacco: Never Used  . Alcohol use 0.6 oz/week    1 Shots of liquor per week     Comment: occasional  . Drug use: No  . Sexual activity: Not on file   Other Topics Concern  . Not on file   Social History Narrative  . No narrative on file    Review of Systems: See HPI, otherwise negative ROS  Physical Exam: BP 135/89   Pulse 72   Temp 97.4 F (36.3 C) (Tympanic)   Resp 20   Ht 5\' 10"  (1.778 m)   Wt 292 lb (132.5 kg)   LMP 11/20/2015 (Exact Date)   SpO2 100%   BMI 41.90 kg/m  General:   Alert,  pleasant and cooperative in NAD Head:  Normocephalic and atraumatic. Neck:  Supple; no masses or thyromegaly. Lungs:  Clear throughout to auscultation.    Heart:  Regular rate and rhythm. Abdomen:  Soft, nontender and nondistended. Normal bowel sounds, without guarding, and without rebound.   Neurologic:  Alert and  oriented x4;  grossly normal neurologically.  Impression/Plan: Jill Moon is here for an ERCP to be performed for stent removal  Risks, benefits, limitations, and alternatives regarding  ERCP have been reviewed with the patient.  Questions have been answered.  All parties agreeable.  Midge Miniumarren Abdou Stocks, MD  11/29/2015, 11:14 AM

## 2015-11-29 NOTE — Transfer of Care (Signed)
Immediate Anesthesia Transfer of Care Note  Patient: Jill Moon  Procedure(s) Performed: Procedure(s): ENDOSCOPIC RETROGRADE CHOLANGIOPANCREATOGRAPHY (ERCP) stent removal (N/A)  Patient Location: PACU  Anesthesia Type:General  Level of Consciousness: awake, oriented and patient cooperative  Airway & Oxygen Therapy: Patient Spontanous Breathing and Patient connected to face mask oxygen  Post-op Assessment: Report given to RN and Post -op Vital signs reviewed and stable  Post vital signs: Reviewed and stable  Last Vitals:  Vitals:   11/29/15 1036  BP: 135/89  Pulse: 72  Resp: 20  Temp: 36.3 C    Last Pain:  Vitals:   11/29/15 1036  TempSrc: Tympanic         Complications: No apparent anesthesia complications

## 2015-11-29 NOTE — Anesthesia Procedure Notes (Signed)
Procedure Name: Intubation Date/Time: 11/29/2015 12:04 PM Performed by: Charna BusmanIAMOND, Jill Moon Pre-anesthesia Checklist: Patient identified, Patient being monitored, Timeout performed, Emergency Drugs available and Suction available Patient Re-evaluated:Patient Re-evaluated prior to inductionOxygen Delivery Method: Circle system utilized Preoxygenation: Pre-oxygenation with 100% oxygen Intubation Type: IV induction and Combination inhalational/ intravenous induction Ventilation: Mask ventilation without difficulty Laryngoscope Size: Mac and 3 Grade View: Grade II Tube type: Oral Tube size: 7.0 mm Number of attempts: 1 Airway Equipment and Method: Stylet Placement Confirmation: ETT inserted through vocal cords under direct vision,  positive ETCO2,  breath sounds checked- equal and bilateral and CO2 detector Secured at: 21 cm Tube secured with: Tape Dental Injury: Teeth and Oropharynx as per pre-operative assessment

## 2015-11-29 NOTE — Anesthesia Postprocedure Evaluation (Signed)
Anesthesia Post Note  Patient: Jill Moon  Procedure(s) Performed: Procedure(s) (LRB): ENDOSCOPIC RETROGRADE CHOLANGIOPANCREATOGRAPHY (ERCP) stent removal (N/A)  Patient location during evaluation: PACU Anesthesia Type: General Level of consciousness: awake Pain management: satisfactory to patient Respiratory status: spontaneous breathing Cardiovascular status: stable Anesthetic complications: no    Last Vitals:  Vitals:   11/29/15 1313 11/29/15 1320  BP:  113/67  Pulse: 81 80  Resp: 16   Temp:  36.6 C    Last Pain:  Vitals:   11/29/15 1320  TempSrc: Tympanic                 VAN STAVEREN,Chazz Philson

## 2015-11-29 NOTE — Op Note (Signed)
[facility] Gastroenterology Patient Name: Jill Moon Procedure Date: 11/29/2015 11:57 AM MRN: [id number] Account #: [id number] Date of Birth: 1990/10/08 Admit Type: Outpatient Age: 25 [facility] ROOM 4 Gender: Female Note Status: Finalized Procedure:            ERCP Indications:          Biliary stent removal Providers:            Midge Miniumarren Zakhia Seres MD, MD Referring MD:         Margaretann LovelessJennifer M. Burnette (Referring MD) Medicines:            General Anesthesia Complications:        No immediate complications. Procedure:            Pre-Anesthesia Assessment:                       - Prior to the procedure, a History and Physical was                        performed, and patient medications and allergies were                        reviewed. The patient's tolerance of previous                        anesthesia was also reviewed. The risks and benefits of                        the procedure and the sedation options and risks were                        discussed with the patient. All questions were                        answered, and informed consent was obtained. Prior                        Anticoagulants: The patient has taken no previous                        anticoagulant or antiplatelet agents. ASA Grade                        Assessment: II - A patient with mild systemic disease.                        After reviewing the risks and benefits, the patient was                        deemed in satisfactory condition to undergo the                        procedure.                       After obtaining informed consent, the scope was passed                        under direct vision. Throughout the procedure, the  patient's blood pressure, pulse, and oxygen saturations                        were monitored continuously. The ERCP was introduced                        through the mouth, and used to inject contrast into and   used to inject contrast into the bile duct. The ERCP                        was accomplished without difficulty. The patient                        tolerated the procedure well. Findings:      A biliary stent was visible on the scout film. One plastic stent       originating in the biliary tree was emerging from the major papilla. One       stent was removed from the biliary tree using a snare. A straight       Roadrunner wire was passed into the biliary tree. The bile duct was       deeply cannulated with the short-nosed traction sphincterotome. Contrast       was injected. I personally interpreted the bile duct images. There was       brisk flow of contrast through the ducts. Image quality was excellent.       Contrast extended to the entire biliary tree. To discover objects, the       biliary tree was swept with a 15 mm balloon starting at the bifurcation.       Nothing was found. Impression:           - One stent from the biliary tree was seen in the major                        papilla.                       - One stent was removed from the biliary tree.                       - The biliary tree was swept and nothing was found. Recommendation:       - Resume previous diet.                       - Continue present medications. Procedure Code(s):    --- Professional ---                       [phone], Endoscopic retrograde cholangiopancreatography                        (ERCP); with removal of foreign body(s) or stent(s)                        from biliary/pancreatic duct(s)                       60454, Endoscopic catheterization of the biliary ductal                        system, radiological supervision and interpretation Diagnosis Code(s):    ---  Professional ---                       Z46.59, Encounter for fitting and adjustment of other                        gastrointestinal appliance and device CPT copyright 2016 American Medical Association. All rights reserved. The codes  documented in this report are preliminary and upon coder review may  be revised to meet current compliance requirements. Midge Minium MD, MD 11/29/2015 12:22:54 PM This report has been signed electronically. Number of Addenda: 0 Note Initiated On: 11/29/2015 11:57 AM      [facility]

## 2015-11-29 NOTE — Anesthesia Preprocedure Evaluation (Addendum)
Anesthesia Evaluation  Patient identified by MRN, date of birth, ID band Patient awake    Reviewed: Allergy & Precautions, NPO status , Patient's Chart, lab work & pertinent test results  Airway Mallampati: II       Dental no notable dental hx. (+) Teeth Intact   Pulmonary neg pulmonary ROS, Current Smoker,    Pulmonary exam normal breath sounds clear to auscultation       Cardiovascular negative cardio ROS Normal cardiovascular exam Rhythm:Regular Rate:Normal     Neuro/Psych Depression negative neurological ROS     GI/Hepatic Neg liver ROS, Bile duct stones   Endo/Other  negative endocrine ROS  Renal/GU negative Renal ROS  Female GU complaint     Musculoskeletal negative musculoskeletal ROS (+)   Abdominal (+) + obese,   Peds negative pediatric ROS (+)  Hematology negative hematology ROS (+)   Anesthesia Other Findings Bile duct stones Depression Polycystic ovarian disease  Reproductive/Obstetrics                            Anesthesia Physical Anesthesia Plan  ASA: III  Anesthesia Plan: General   Post-op Pain Management:    Induction: Intravenous  Airway Management Planned: Oral ETT  Additional Equipment:   Intra-op Plan:   Post-operative Plan: Extubation in OR  Informed Consent: I have reviewed the patients History and Physical, chart, labs and discussed the procedure including the risks, benefits and alternatives for the proposed anesthesia with the patient or authorized representative who has indicated his/her understanding and acceptance.   Dental advisory given  Plan Discussed with: CRNA and Surgeon  Anesthesia Plan Comments:        Anesthesia Quick Evaluation

## 2015-11-30 ENCOUNTER — Encounter: Payer: Self-pay | Admitting: Gastroenterology

## 2016-01-31 ENCOUNTER — Ambulatory Visit (INDEPENDENT_AMBULATORY_CARE_PROVIDER_SITE_OTHER): Payer: BLUE CROSS/BLUE SHIELD | Admitting: Physician Assistant

## 2016-01-31 ENCOUNTER — Encounter: Payer: Self-pay | Admitting: Physician Assistant

## 2016-01-31 VITALS — BP 120/78 | HR 84 | Temp 97.7°F | Resp 16 | Wt 290.2 lb

## 2016-01-31 DIAGNOSIS — J0140 Acute pansinusitis, unspecified: Secondary | ICD-10-CM

## 2016-01-31 DIAGNOSIS — Z9049 Acquired absence of other specified parts of digestive tract: Secondary | ICD-10-CM | POA: Insufficient documentation

## 2016-01-31 MED ORDER — AMOXICILLIN-POT CLAVULANATE 875-125 MG PO TABS: 1.0000 | ORAL_TABLET | Freq: Two times a day (BID) | ORAL | 0 refills | Status: DC

## 2016-01-31 NOTE — Progress Notes (Signed)
Patient: Jill GreathouseLinzy T Montesdeoca Female    DOB: 09-05-1990   25 y.o.   MRN: [id number] Visit Date: 01/31/2016  Today's Provider: Margaretann LovelessJennifer M Eulamae Greenstein, PA-C   Chief Complaint  Patient presents with  . URI   Subjective:    URI   This is a new problem. The current episode started in the past 7 days. The problem has been gradually worsening. The maximum temperature recorded prior to her arrival was 101 - 101.9 F (102.9 this morning). Associated symptoms include chest pain (comes off and on), congestion, coughing, ear pain, headaches, rhinorrhea, sinus pain, sneezing and a sore throat. Pertinent negatives include no abdominal pain or wheezing. She has tried acetaminophen (Took tylenol this morning) for the symptoms. The treatment provided no relief.      No Known Allergies   Current Outpatient Prescriptions:  .  RECLIPSEN 0.15-30 MG-MCG tablet, Take 1 tablet by mouth daily. , Disp: , Rfl: 8 .  metFORMIN (GLUCOPHAGE) 500 MG tablet, Take 1 tablet (500 mg total) by mouth daily with breakfast. (Patient not taking: Reported on 01/31/2016), Disp: 30 tablet, Rfl: 6 .  vortioxetine HBr (TRINTELLIX) 10 MG TABS, Take 1 tablet (10 mg total) by mouth daily. (Patient not taking: Reported on 01/31/2016), Disp: 7 tablet, Rfl: 1  Review of Systems  Constitutional: Positive for chills, fatigue and fever.  HENT: Positive for congestion, ear pain, postnasal drip, rhinorrhea, sinus pain, sinus pressure, sneezing and sore throat. Negative for tinnitus and trouble swallowing.   Respiratory: Positive for cough, chest tightness and shortness of breath. Negative for wheezing.   Cardiovascular: Positive for chest pain (comes off and on). Negative for palpitations and leg swelling.  Gastrointestinal: Negative for abdominal pain.  Neurological: Positive for headaches. Negative for dizziness.    Social History  Substance Use Topics  . Smoking status: Light Tobacco Smoker  . Smokeless tobacco: Never Used  .  Alcohol use 0.6 oz/week    1 Shots of liquor per week     Comment: occasional   Objective:   BP 120/78 (BP Location: Right Arm, Patient Position: Sitting, Cuff Size: Normal)   Pulse 84   Temp 97.7 F (36.5 C) (Oral)   Resp 16   Wt 290 lb 3.2 oz (131.6 kg)   BMI 41.64 kg/m   Physical Exam  Constitutional: She appears well-developed and well-nourished. No distress.  HENT:  Head: Normocephalic and atraumatic.  Right Ear: Hearing, tympanic membrane, external ear and ear canal normal.  Left Ear: Hearing, tympanic membrane, external ear and ear canal normal.  Nose: Right sinus exhibits maxillary sinus tenderness and frontal sinus tenderness. Left sinus exhibits maxillary sinus tenderness and frontal sinus tenderness.  Mouth/Throat: Uvula is midline, oropharynx is clear and moist and mucous membranes are normal. No oropharyngeal exudate.  Neck: Normal range of motion. Neck supple. No tracheal deviation present. No thyromegaly present.  Cardiovascular: Normal rate, regular rhythm and normal heart sounds.  Exam reveals no gallop and no friction rub.   No murmur heard. Pulmonary/Chest: Effort normal and breath sounds normal. No stridor. No respiratory distress. She has no wheezes. She has no rales.  Lymphadenopathy:    She has no cervical adenopathy.  Skin: She is not diaphoretic.  Vitals reviewed.       Assessment & Plan:     1. Acute pansinusitis, recurrence not specified Worsening symptoms that have not responded to OTC medications. Will give augmentin as below. Continue allergy medications. Stay well hydrated and get plenty  of rest. Call if no symptom improvement or if symptoms worsen. - amoxicillin-clavulanate (AUGMENTIN) 875-125 MG tablet; Take 1 tablet by mouth 2 (two) times daily.  Dispense: 20 tablet; Refill: 0        Margaretann LovelessJennifer M Telesha Deguzman, PA-C  [facility]

## 2016-01-31 NOTE — Patient Instructions (Signed)

## 2016-04-02 DIAGNOSIS — H16042 Marginal corneal ulcer, left eye: Secondary | ICD-10-CM | POA: Diagnosis not present

## 2016-04-26 ENCOUNTER — Encounter: Payer: Self-pay | Admitting: Physician Assistant

## 2016-04-26 ENCOUNTER — Ambulatory Visit (INDEPENDENT_AMBULATORY_CARE_PROVIDER_SITE_OTHER): Payer: BLUE CROSS/BLUE SHIELD | Admitting: Physician Assistant

## 2016-04-26 VITALS — BP 110/70 | HR 82 | Temp 98.2°F | Resp 16 | Ht 70.0 in | Wt 306.0 lb

## 2016-04-26 DIAGNOSIS — E6609 Other obesity due to excess calories: Secondary | ICD-10-CM | POA: Diagnosis not present

## 2016-04-26 DIAGNOSIS — IMO0001 Reserved for inherently not codable concepts without codable children: Secondary | ICD-10-CM

## 2016-04-26 DIAGNOSIS — Z6841 Body Mass Index (BMI) 40.0 and over, adult: Secondary | ICD-10-CM | POA: Diagnosis not present

## 2016-04-26 DIAGNOSIS — Z713 Dietary counseling and surveillance: Secondary | ICD-10-CM

## 2016-04-26 MED ORDER — PHENTERMINE HCL 37.5 MG PO TABS: 37.5000 mg | ORAL_TABLET | Freq: Every day | ORAL | 0 refills | Status: DC

## 2016-04-26 NOTE — Progress Notes (Signed)
Patient: Jill Moon Female    DOB: 01-Apr-1990   26 y.o.   MRN: [id number] Visit Date: 04/26/2016  Today's Provider: Margaretann Loveless, PA-C   Chief Complaint  Patient presents with  . Obesity   Subjective:    Patient reports that she has taken phentermine in the past and lost 20 pounds the first month. Patient took phentermine April-July of 2017.   Obesity: Patient complains of obesity. Patient cites health as reasons for wanting to lose weight.  Obesity History Weight in late teens: 198 lb. Period of greatest weight gain: 306 lb during present Lowest adult weight: 198   History of Weight Loss Efforts Greatest amount of weight lost: 20 lb over 01 months Amount of time that loss was maintained: 4 months Successful weight loss techniques attempted: nutritionist consultation, supervised diet program and [employer] started on Monday.   Current Exercise Habits walking  Current Eating Habits Number of regular meals per day: 3 Number of snacking episodes per day: 1 Who shops for food? patient Who prepares food? patient Who eats with patient? patient and mother  Other Potential Contributing Factors Use of alcohol: average 0 drinks/week Use of medications that may cause weight gain none History of past abuse? none Psych History: depression Comorbidities: none     No Known Allergies  No current outpatient prescriptions on file.  Review of Systems  Constitutional: Negative.   Respiratory: Negative.   Cardiovascular: Negative.   Musculoskeletal: Negative.   Psychiatric/Behavioral: Negative.     Social History  Substance Use Topics  . Smoking status: Light Tobacco Smoker  . Smokeless tobacco: Never Used     Comment: Per patient she has not smoke for the past six months  . Alcohol use 0.6 oz/week    1 Shots of liquor per week     Comment: occasional   Objective:   BP 110/70 (BP Location: Left Arm, Patient Position: Sitting, Cuff Size: Large)    Pulse 82   Temp 98.2 F (36.8 C) (Oral)   Resp 16   Ht 5\' 10"  (1.778 m)   Wt (!) 306 lb (138.8 kg)   SpO2 98%   BMI 43.91 kg/m   Physical Exam  Constitutional: She appears well-developed and well-nourished. No distress.  Neck: Normal range of motion. Neck supple. No tracheal deviation present. No thyromegaly present.  Cardiovascular: Normal rate, regular rhythm and normal heart sounds.  Exam reveals no gallop and no friction rub.   No murmur heard. Pulmonary/Chest: Effort normal and breath sounds normal. No respiratory distress. She has no wheezes. She has no rales.  Lymphadenopathy:    She has no cervical adenopathy.  Skin: She is not diaphoretic.  Psychiatric: She has a normal mood and affect. Her behavior is normal. Judgment and thought content normal.  Vitals reviewed.     Assessment & Plan:     1. Encounter for weight loss counseling She has a good plan in place with weight watchers and a goal of 40 pounds to be lost by August. Will start phentermine to help with cravings. I will see her back in one month for weight check. May consider adding metformin back for her PCOS at that time as well.  - phentermine (ADIPEX-P) 37.5 MG tablet; Take 1 tablet (37.5 mg total) by mouth daily before breakfast.  Dispense: 30 tablet; Refill: 0  2. Class 3 obesity due to excess calories without serious comorbidity with body mass index (BMI) of 40.0 to 44.9  in adult [facility](HCC) See above medical treatment plan. - phentermine (ADIPEX-P) 37.5 MG tablet; Take 1 tablet (37.5 mg total) by mouth daily before breakfast.  Dispense: 30 tablet; Refill: 0       Margaretann LovelessJennifer M Kedra Mcglade, PA-C  [facility]

## 2016-04-26 NOTE — Patient Instructions (Signed)

## 2016-05-28 ENCOUNTER — Ambulatory Visit: Payer: BLUE CROSS/BLUE SHIELD | Admitting: Physician Assistant

## 2016-06-08 ENCOUNTER — Ambulatory Visit (INDEPENDENT_AMBULATORY_CARE_PROVIDER_SITE_OTHER): Payer: BLUE CROSS/BLUE SHIELD | Admitting: Physician Assistant

## 2016-06-08 ENCOUNTER — Encounter: Payer: Self-pay | Admitting: Physician Assistant

## 2016-06-08 VITALS — BP 110/78 | HR 92 | Temp 97.8°F | Resp 16 | Wt 306.2 lb

## 2016-06-08 DIAGNOSIS — N946 Dysmenorrhea, unspecified: Secondary | ICD-10-CM | POA: Diagnosis not present

## 2016-06-08 DIAGNOSIS — N921 Excessive and frequent menstruation with irregular cycle: Secondary | ICD-10-CM

## 2016-06-08 DIAGNOSIS — E282 Polycystic ovarian syndrome: Secondary | ICD-10-CM

## 2016-06-08 MED ORDER — HYDROCODONE-ACETAMINOPHEN 5-325 MG PO TABS: 1.0000 | ORAL_TABLET | Freq: Three times a day (TID) | ORAL | 0 refills | Status: DC | PRN

## 2016-06-08 NOTE — Patient Instructions (Signed)
Dysmenorrhea Menstrual cramps (dysmenorrhea) are caused by the muscles of the uterus tightening (contracting) during a menstrual period. For some women, this discomfort is merely bothersome. For others, dysmenorrhea can be severe enough to interfere with everyday activities for a few days each month. Primary dysmenorrhea is menstrual cramps that last a couple of days when you start having menstrual periods or soon after. This often begins after a teenager starts having her period. As a woman gets older or has a baby, the cramps will usually lessen or disappear. Secondary dysmenorrhea begins later in life, lasts longer, and the pain may be stronger than primary dysmenorrhea. The pain may start before the period and last a few days after the period. What are the causes? Dysmenorrhea is usually caused by an underlying problem, such as:  The tissue lining the uterus grows outside of the uterus in other areas of the body (endometriosis).  The endometrial tissue, which normally lines the uterus, is found in or grows into the muscular walls of the uterus (adenomyosis).  The pelvic blood vessels are engorged with blood just before the menstrual period (pelvic congestive syndrome).  Overgrowth of cells (polyps) in the lining of the uterus or cervix.  Falling down of the uterus (prolapse) because of loose or stretched ligaments.  Depression.  Bladder problems, infection, or inflammation.  Problems with the intestine, a tumor, or irritable bowel syndrome.  Cancer of the female organs or bladder.  A severely tipped uterus.  A very tight opening or closed cervix.  Noncancerous tumors of the uterus (fibroids).  Pelvic inflammatory disease (PID).  Pelvic scarring (adhesions) from a previous surgery.  Ovarian cyst.  An intrauterine device (IUD) used for birth control. What increases the risk? You may be at greater risk of dysmenorrhea if:  You are younger than age 30.  You started puberty  early.  You have irregular or heavy bleeding.  You have never given birth.  You have a family history of this problem.  You are a smoker. What are the signs or symptoms?  Cramping or throbbing pain in your lower abdomen.  Headaches.  Lower back pain.  Nausea or vomiting.  Diarrhea.  Sweating or dizziness.  Loose stools. How is this diagnosed? A diagnosis is based on your history, symptoms, physical exam, diagnostic tests, or procedures. Diagnostic tests or procedures may include:  Blood tests.  Ultrasonography.  An examination of the lining of the uterus (dilation and curettage, D&C).  An examination inside your abdomen or pelvis with a scope (laparoscopy).  X-rays.  CT scan.  MRI.  An examination inside the bladder with a scope (cystoscopy).  An examination inside the intestine or stomach with a scope (colonoscopy, gastroscopy). How is this treated? Treatment depends on the cause of the dysmenorrhea. Treatment may include:  Pain medicine prescribed by your health care provider.  Birth control pills or an IUD with progesterone hormone in it.  Hormone replacement therapy.  Nonsteroidal anti-inflammatory drugs (NSAIDs). These may help stop the production of prostaglandins.  Surgery to remove adhesions, endometriosis, ovarian cyst, or fibroids.  Removal of the uterus (hysterectomy).  Progesterone shots to stop the menstrual period.  Cutting the nerves on the sacrum that go to the female organs (presacral neurectomy).  Electric current to the sacral nerves (sacral nerve stimulation).  Antidepressant medicine.  Psychiatric therapy, counseling, or group therapy.  Exercise and physical therapy.  Meditation and yoga therapy.  Acupuncture. Follow these instructions at home:  Only take over-the-counter or prescription medicines as directed   by your health care provider.  Place a heating pad or hot water bottle on your lower back or abdomen. Do not  sleep with the heating pad.  Use aerobic exercises, walking, swimming, biking, and other exercises to help lessen the cramping.  Massage to the lower back or abdomen may help.  Stop smoking.  Avoid alcohol and caffeine. Contact a health care provider if:  Your pain does not get better with medicine.  You have pain with sexual intercourse.  Your pain increases and is not controlled with medicines.  You have abnormal vaginal bleeding with your period.  You develop nausea or vomiting with your period that is not controlled with medicine. Get help right away if: You pass out. This information is not intended to replace advice given to you by your health care provider. Make sure you discuss any questions you have with your health care provider. Document Released: 02/19/2005 Document Revised: 07/28/2015 Document Reviewed: 08/07/2012 Elsevier Interactive Patient Education  2017 [employer] Polycystic Ovarian Syndrome Polycystic ovarian syndrome (PCOS) is a common hormonal disorder among women of reproductive age. In most women with PCOS, many small fluid-filled sacs (cysts) grow on the ovaries, and the cysts are not part of a normal menstrual cycle. PCOS can cause problems with your menstrual periods and make it difficult to get pregnant. It can also cause an increased risk of miscarriage with pregnancy. If it is not treated, PCOS can lead to serious health problems, such as diabetes and heart disease. What are the causes? The cause of PCOS is not known, but it may be the result of a combination of certain factors, such as:  Irregular menstrual cycle.  High levels of certain hormones (androgens).  Problems with the hormone that helps to control blood sugar (insulin resistance).  Certain genes. What increases the risk? This condition is more likely to develop in women who have a family history of PCOS. What are the signs or symptoms? Symptoms of PCOS may include:  Multiple  ovarian cysts.  Infrequent periods or no periods.  Periods that are too frequent or too heavy.  Unpredictable periods.  Inability to get pregnant (infertility) because of not ovulating.  Increased growth of hair on the face, chest, stomach, back, thumbs, thighs, or toes.  Acne or oily skin. Acne may develop during adulthood, and it may not respond to treatment.  Pelvic pain.  Weight gain or obesity.  Patches of thickened and dark brown or black skin on the neck, arms, breasts, or thighs (acanthosis nigricans).  Excess hair growth on the face, chest, abdomen, or upper thighs (hirsutism). How is this diagnosed? This condition is diagnosed based on:  Your medical history.  A physical exam, including a pelvic exam. Your health care provider may look for areas of increased hair growth on your skin.  Tests, such as:  Ultrasound. This may be used to examine the ovaries and the lining of the uterus (endometrium) for cysts.  Blood tests. These may be used to check levels of sugar (glucose), female hormone (testosterone), and female hormones (estrogen and progesterone) in your blood. How is this treated? There is no cure for PCOS, but treatment can help to manage symptoms and prevent more health problems from developing. Treatment varies depending on:  Your symptoms.  Whether you want to have a baby or whether you need birth control (contraception). Treatment may include nutrition and lifestyle changes along with:  Progesterone hormone to start a menstrual period.  Birth control pills to help you  have regular menstrual periods.  Medicines to make you ovulate, if you want to get pregnant.  Medicine to reduce excessive hair growth.  Surgery, in severe cases. This may involve making small holes in one or both of your ovaries. This decreases the amount of testosterone that your body produces. Follow these instructions at home:  Take over-the-counter and prescription medicines only  as told by your health care provider.  Follow a healthy meal plan. This can help you reduce the effects of PCOS.  Eat a healthy diet that includes lean proteins, complex carbohydrates, fresh fruits and vegetables, low-fat dairy products, and healthy fats. Make sure to eat enough fiber.  If you are overweight, lose weight as told by your health care provider.  Losing 10% of your body weight may improve symptoms.  Your health care provider can determine how much weight loss is best for you and can help you lose weight safely.  Keep all follow-up visits as told by your health care provider. This is important. Contact a health care provider if:  Your symptoms do not get better with medicine.  You develop new symptoms. This information is not intended to replace advice given to you by your health care provider. Make sure you discuss any questions you have with your health care provider. Document Released: 06/15/2004 Document Revised: 10/18/2015 Document Reviewed: 08/07/2015 Elsevier Interactive Patient Education  2017 [employer].

## 2016-06-08 NOTE — Progress Notes (Signed)
Patient: Jill Moon Female    DOB: [birth year]   26 y.o.   MRN: [id number] Visit Date: 06/08/2016  Today's Provider: Margaretann Loveless, PA-C   No chief complaint on file.  Subjective:    HPI Patient is here today with c/o of lower abdominal pain and brown discharge. Patient has PCOS. She reports that she is sleeping poorly because of the pain. She reports she went off the birth control pill (Reclipsen) but restarted taking it.  She has been followed by Althea Grimmer at [facility]. She does have known PCOS. She feels that she may have had a cyst rupture that has thrown off her menstrual cycle. She reports she does normally have heavy menstrual cycles. The OCP does help regulate her cycle but it is always heavy. She had stopped her OCP but restarted 1.5 months ago. After restarting she had a normal menstrual cycle at the end of the 1st pack but is 2 weeks into this pack and developed the pain and brown discharge. She reports the discharge has now turned to a normal bloody menstrual cycle. She is having nausea.  Also discussed possibility of pregnancy but patient denies stating she has not been sexually active over the last 2-3 months.    No Known Allergies   Current Outpatient Prescriptions:  .  EMOQUETTE 0.15-30 MG-MCG tablet, TK 1 T PO ONCE D, Disp: , Rfl: 8 .  phentermine (ADIPEX-P) 37.5 MG tablet, Take 1 tablet (37.5 mg total) by mouth daily before breakfast. (Patient not taking: Reported on 06/08/2016), Disp: 30 tablet, Rfl: 0  Review of Systems  Constitutional: Negative.   Respiratory: Negative.   Cardiovascular: Negative for chest pain, palpitations and leg swelling.  Gastrointestinal: Positive for abdominal pain and nausea.  Genitourinary: Positive for menstrual problem, pelvic pain, vaginal bleeding and vaginal discharge. Negative for dysuria, flank pain, genital sores and vaginal pain.  Musculoskeletal: Positive for back pain.    Social History  Substance  Use Topics  . Smoking status: Light Tobacco Smoker  . Smokeless tobacco: Never Used     Comment: Per patient she has not smoke for the past six months  . Alcohol use 0.6 oz/week    1 Shots of liquor per week     Comment: occasional   Objective:   BP 110/78 (BP Location: Right Arm, Patient Position: Sitting, Cuff Size: Large)   Pulse 92   Temp 97.8 F (36.6 C) (Oral)   Resp 16   Wt (!) 306 lb 3.2 oz (138.9 kg)   BMI 43.94 kg/m    Physical Exam  Constitutional: She is oriented to person, place, and time. She appears well-developed and well-nourished. No distress.  Cardiovascular: Normal rate, regular rhythm and normal heart sounds.  Exam reveals no gallop and no friction rub.   No murmur heard. Pulmonary/Chest: Effort normal and breath sounds normal. No respiratory distress. She has no wheezes. She has no rales.  Abdominal: Soft. Normal appearance and bowel sounds are normal. She exhibits no distension and no mass. There is no hepatosplenomegaly. There is tenderness in the right lower quadrant, suprapubic area and left lower quadrant. There is no rebound, no guarding and no CVA tenderness.  Neurological: She is alert and oriented to person, place, and time.  Skin: Skin is warm and dry. She is not diaphoretic.  Vitals reviewed.      Assessment & Plan:     1. PCOS (polycystic ovarian syndrome) Suspect ovarian cyst rupture vs uterine  fibroid vs just adjusting to restarting OCP. Will give Norco as below for pain. She is to call to f/u with Helmut Muster Copland at [facility] on Monday if pain persists. If she cannot get an appt she is to call here and I will order pelvic and transvaginal US for further investigation.  - HYDROcodone-acetaminophen (NORCO/VICODIN) 5-325 MG tablet; Take 1 tablet by mouth every 8 (eight) hours as needed for moderate pain.  Dispense: 20 tablet; Refill: 0  2. Menorrhagia with irregular cycle See above medical treatment plan. - HYDROcodone-acetaminophen  (NORCO/VICODIN) 5-325 MG tablet; Take 1 tablet by mouth every 8 (eight) hours as needed for moderate pain.  Dispense: 20 tablet; Refill: 0  3. Dysmenorrhea See above medical treatment plan. - HYDROcodone-acetaminophen (NORCO/VICODIN) 5-325 MG tablet; Take 1 tablet by mouth every 8 (eight) hours as needed for moderate pain.  Dispense: 20 tablet; Refill: 0       Margaretann Loveless, PA-C  [facility]

## 2016-06-20 ENCOUNTER — Encounter: Payer: Self-pay | Admitting: Physician Assistant

## 2016-06-20 ENCOUNTER — Ambulatory Visit (INDEPENDENT_AMBULATORY_CARE_PROVIDER_SITE_OTHER): Payer: BLUE CROSS/BLUE SHIELD | Admitting: Physician Assistant

## 2016-06-20 VITALS — BP 120/70 | HR 88 | Temp 97.6°F | Resp 16 | Wt 307.4 lb

## 2016-06-20 DIAGNOSIS — N200 Calculus of kidney: Secondary | ICD-10-CM

## 2016-06-20 DIAGNOSIS — M545 Low back pain, unspecified: Secondary | ICD-10-CM

## 2016-06-20 LAB — POCT URINALYSIS DIPSTICK
BILIRUBIN UA: NEGATIVE
GLUCOSE UA: NEGATIVE
KETONES UA: NEGATIVE
Leukocytes, UA: NEGATIVE
Nitrite, UA: NEGATIVE
PH UA: 6 (ref 5.0–8.0)
Protein, UA: NEGATIVE
Spec Grav, UA: 1.015 (ref 1.010–1.025)
Urobilinogen, UA: 0.2 E.U./dL

## 2016-06-20 MED ORDER — TAMSULOSIN HCL 0.4 MG PO CAPS: 0.4000 mg | ORAL_CAPSULE | Freq: Every day | ORAL | 3 refills | Status: DC

## 2016-06-20 NOTE — Progress Notes (Signed)
Patient: Jill Moon Female    DOB: 1990/10/16   26 y.o.   MRN: [id number] Visit Date: 06/20/2016  Today's Provider: Margaretann Loveless, PA-C   Chief Complaint  Patient presents with  . Back Pain   Subjective:    Patient reports she has a history of kidney stones.   Back Pain: Patient presents for presents evaluation of low back problems.  Symptoms have been present for 3 days and include pain in left lower back (aching and sharp in character; 8/10 in severity). Initial inciting event: none. Symptoms are worst: with activities. Alleviating factors identifiable by patient are sitting.Treatments so far initiated by patient: hydrocodone Previous lower back problems: yes due to Kidney stones. Previous workup: none. Previous treatments: none. She reports she has had approx. 20 kidney stones in her life, with the first being when she was a teenager around 61. Strong family history of kidney stones on her father's side. States this feels just like previous stones. Has never had to have any stones removed, she has always been able to pass them.     No Known Allergies   Current Outpatient Prescriptions:  .  EMOQUETTE 0.15-30 MG-MCG tablet, TK 1 T PO ONCE D, Disp: , Rfl: 8 .  HYDROcodone-acetaminophen (NORCO/VICODIN) 5-325 MG tablet, Take 1 tablet by mouth every 8 (eight) hours as needed for moderate pain., Disp: 20 tablet, Rfl: 0 .  phentermine (ADIPEX-P) 37.5 MG tablet, Take 1 tablet (37.5 mg total) by mouth daily before breakfast. (Patient not taking: Reported on 06/08/2016), Disp: 30 tablet, Rfl: 0  Review of Systems  Constitutional: Positive for fever (monday night).  Respiratory: Negative.   Cardiovascular: Negative.   Gastrointestinal: Positive for abdominal pain and nausea.  Genitourinary: Positive for flank pain and hematuria.  Musculoskeletal: Positive for back pain.    Social History  Substance Use Topics  . Smoking status: Light Tobacco Smoker  . Smokeless  tobacco: Never Used     Comment: Per patient she has not smoke for the past six months  . Alcohol use 0.6 oz/week    1 Shots of liquor per week     Comment: occasional   Objective:   BP 120/70 (BP Location: Left Arm, Patient Position: Sitting, Cuff Size: Large)   Pulse 88   Temp 97.6 F (36.4 C) (Oral)   Resp 16   Wt (!) 307 lb 6.4 oz (139.4 kg)   SpO2 99%   BMI 44.11 kg/m  Vitals:   06/20/16 1136  BP: 120/70  Pulse: 88  Resp: 16  Temp: 97.6 F (36.4 C)  TempSrc: Oral  SpO2: 99%  Weight: (!) 307 lb 6.4 oz (139.4 kg)     Physical Exam  Constitutional: She is oriented to person, place, and time. She appears well-developed and well-nourished. No distress.  Cardiovascular: Normal rate, regular rhythm and normal heart sounds.  Exam reveals no gallop and no friction rub.   No murmur heard. Pulmonary/Chest: Effort normal and breath sounds normal. No respiratory distress. She has no wheezes. She has no rales.  Abdominal: Soft. Normal appearance and bowel sounds are normal. She exhibits no distension and no mass. There is no hepatosplenomegaly. There is tenderness in the left upper quadrant. There is CVA tenderness (left). There is no rebound and no guarding.  Suprapubic pressure  Neurological: She is alert and oriented to person, place, and time.  Skin: Skin is warm and dry. She is not diaphoretic.  Vitals reviewed.  Assessment & Plan:     1. Kidney stone UA was positive for hematuria. Symptoms consistent with previous kidney stone. Patient has not had any decrease in urination or flow. Will add flomax as below, continue to push fluids, she has Rx for Vicodin already on hand for pain control. She is to call if symptoms worsen or fail to improve and will consider imaging at that time to make sure kidney stone is not too large to pass.  - tamsulosin (FLOMAX) 0.4 MG CAPS capsule; Take 1 capsule (0.4 mg total) by mouth daily.  Dispense: 30 capsule; Refill: 3  2. Acute left-sided  low back pain without sciatica See above medical treatment plan. - POCT urinalysis dipstick       Margaretann Loveless, PA-C  [facility]

## 2016-06-20 NOTE — Patient Instructions (Signed)

## 2016-07-01 IMAGING — US US ABDOMEN LIMITED
1 series · 14 of 25 positions shown · non-contrast
Comparison: CT abdomen and pelvis July 08, 2015

CLINICAL DATA: Upper abdominal pain

EXAM:
US ABDOMEN LIMITED - RIGHT UPPER QUADRANT

[Series 1: us abdomen limited · 0.27mm/px · 14 of 39 slices shown]
[im 1/39]
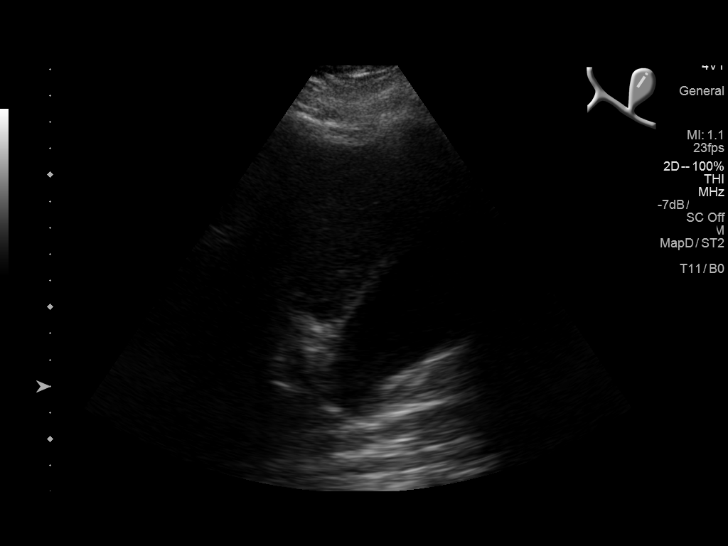
[im 4/39]
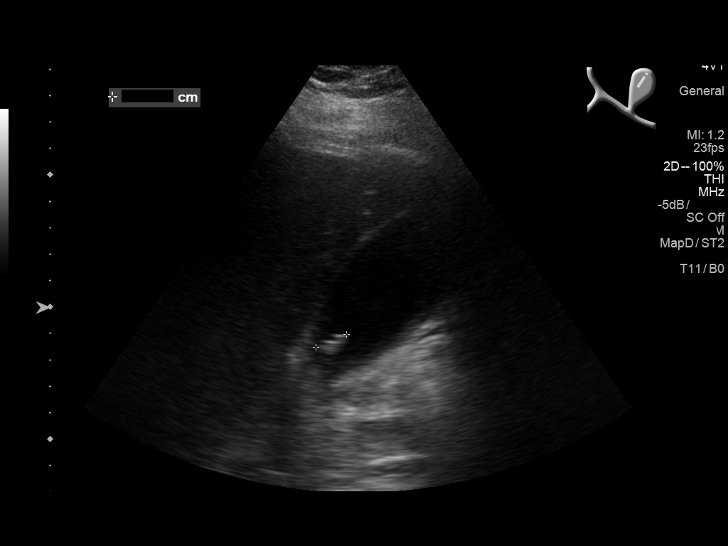
[im 7/39]
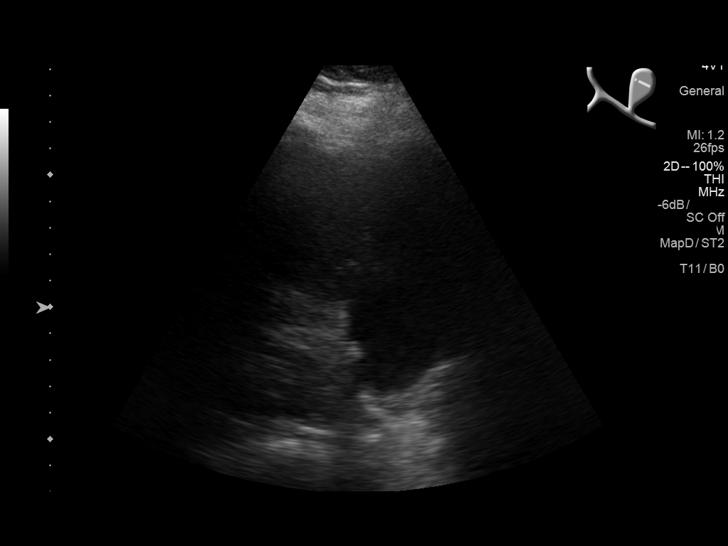
[im 10/39]
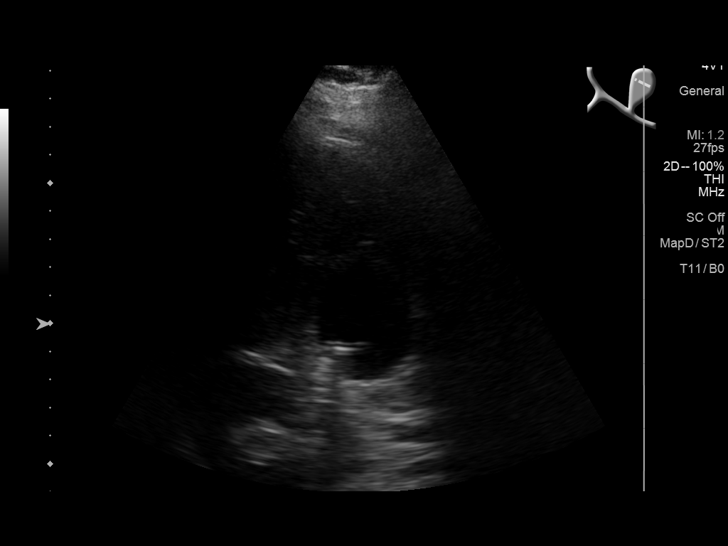
[im 13/39]
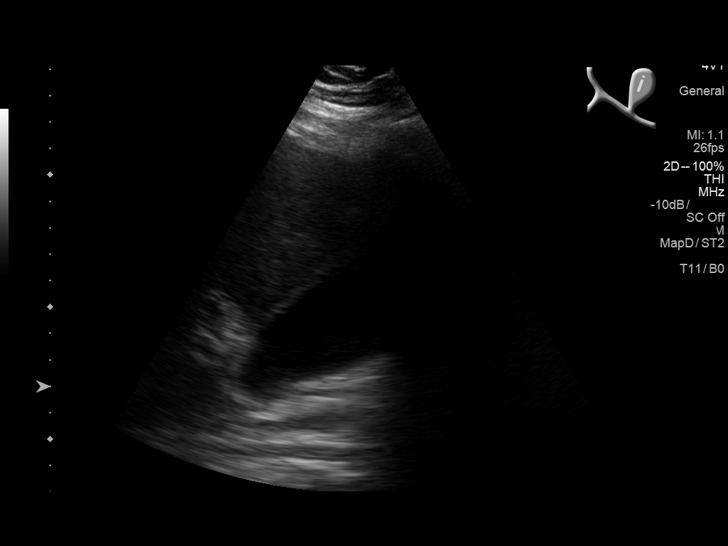
[im 15/39]
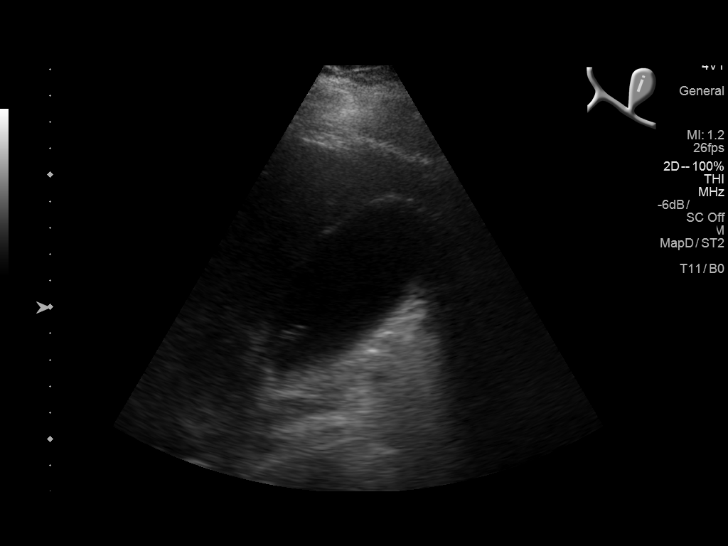
[im 18/39]
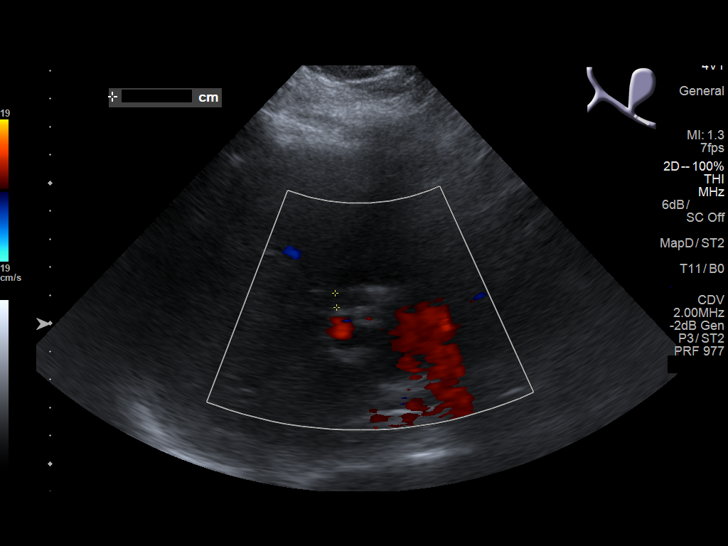
[im 21/39]
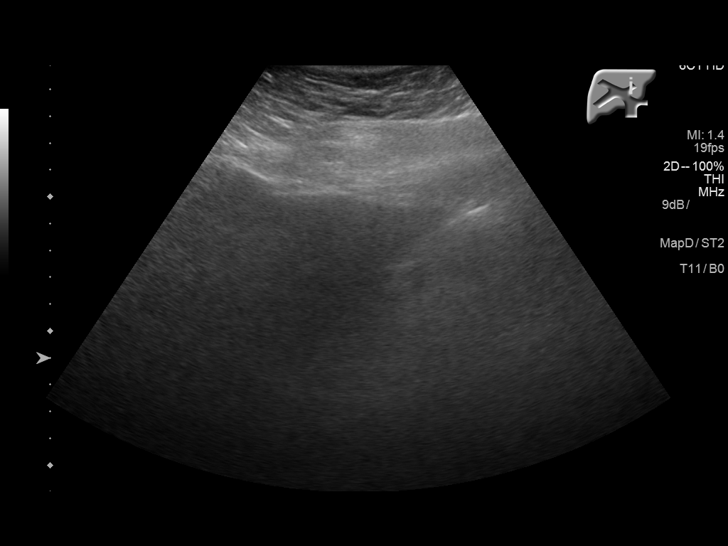
[im 24/39]
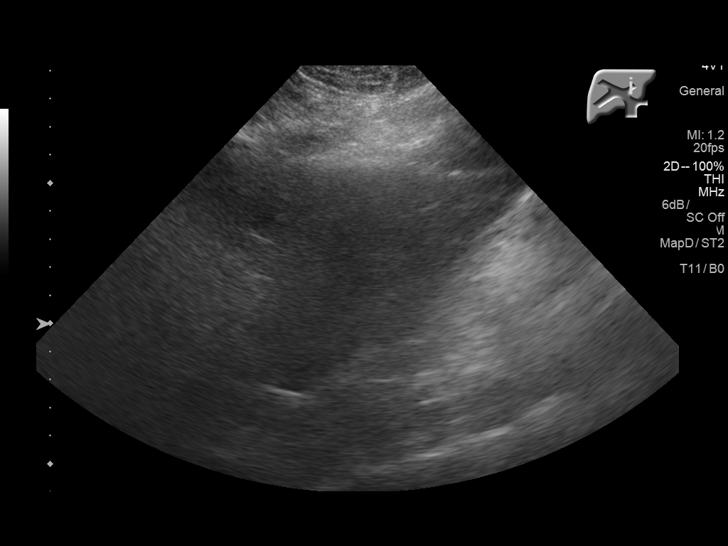
[im 26/39]
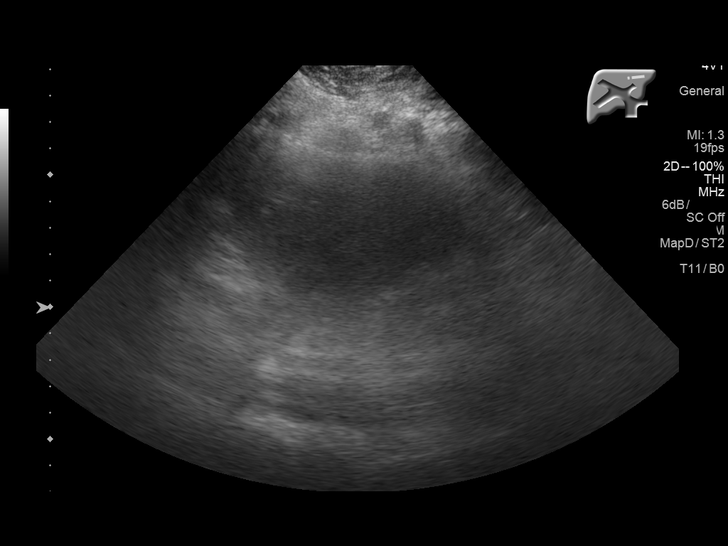
[im 29/39]
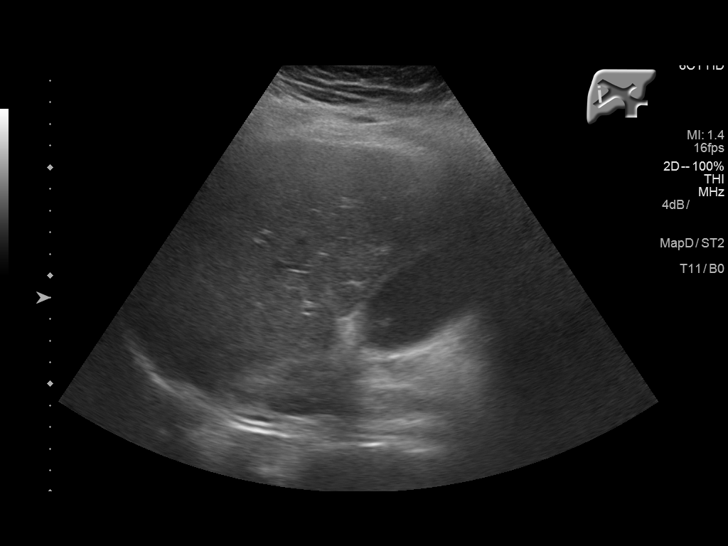
[im 32/39]
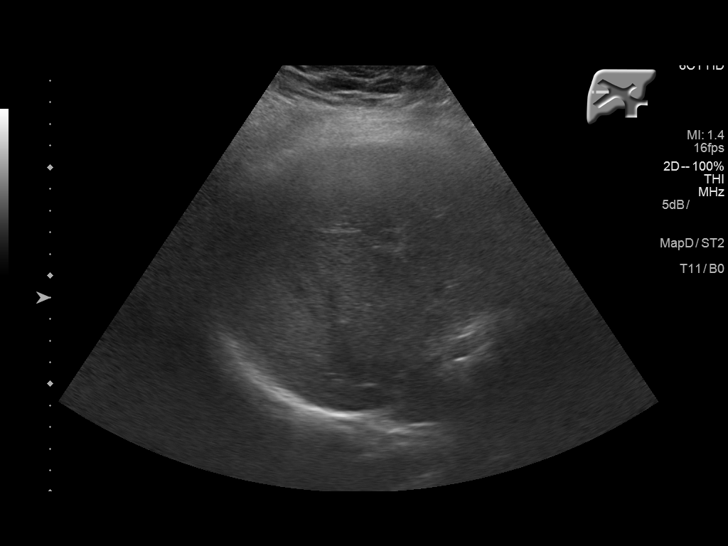
[im 35/39]
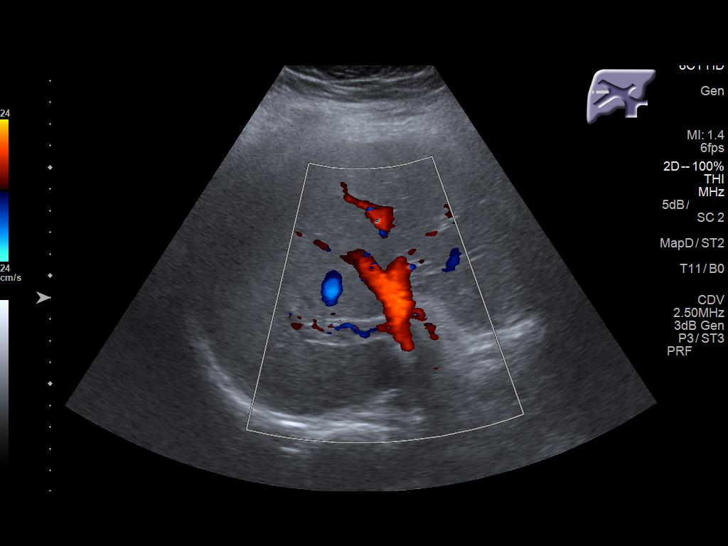
[im 39/39]
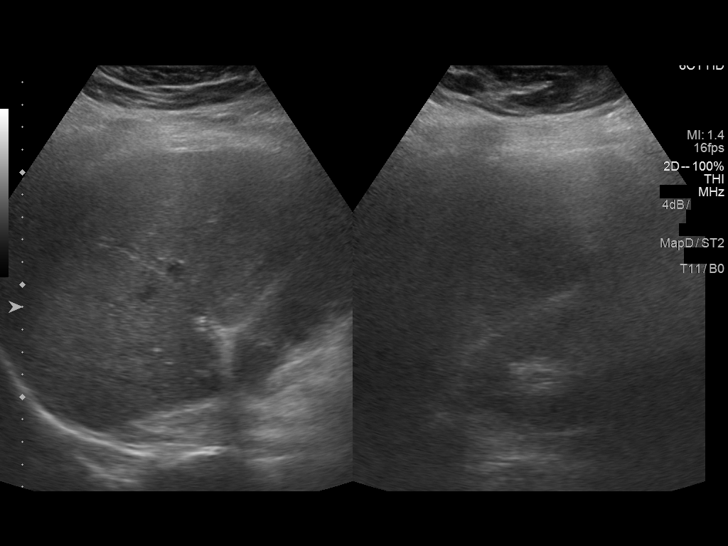

[14 of 25 positions shown; findings below may reference images not displayed]

FINDINGS: Gallbladder:

Within the gallbladder, there is an echogenic focus which moves and
shadows consistent with cholelithiasis. This gallstone measures
cm in length. There is no gallbladder wall thickening or
pericholecystic fluid. No sonographic Murphy sign noted by
sonographer.

Common bile duct:

Diameter: 5 mm. There is no intrahepatic or extrahepatic biliary
duct dilatation.

Liver:

No focal lesion identified.  Liver echogenicity is increased.
IMPRESSION: Cholelithiasis. No gallbladder wall thickening or pericholecystic
fluid. Increased liver echogenicity, a finding most likely
indicative of hepatic steatosis. While no focal liver lesions are
identified, it must be cautioned that the sensitivity of ultrasound
for focal liver lesions is diminished in this circumstance.

## 2016-10-10 ENCOUNTER — Encounter: Payer: Self-pay | Admitting: Physician Assistant

## 2016-10-10 ENCOUNTER — Ambulatory Visit (INDEPENDENT_AMBULATORY_CARE_PROVIDER_SITE_OTHER): Payer: BLUE CROSS/BLUE SHIELD | Admitting: Physician Assistant

## 2016-10-10 VITALS — BP 114/74 | HR 84 | Temp 97.9°F | Resp 16 | Wt 316.6 lb

## 2016-10-10 DIAGNOSIS — R112 Nausea with vomiting, unspecified: Secondary | ICD-10-CM | POA: Diagnosis not present

## 2016-10-10 DIAGNOSIS — R197 Diarrhea, unspecified: Secondary | ICD-10-CM

## 2016-10-10 MED ORDER — PROMETHAZINE HCL 25 MG PO TABS: 25.0000 mg | ORAL_TABLET | Freq: Three times a day (TID) | ORAL | 0 refills | Status: DC | PRN

## 2016-10-10 MED ORDER — DICYCLOMINE HCL 20 MG PO TABS: 20.0000 mg | ORAL_TABLET | Freq: Three times a day (TID) | ORAL | 0 refills | Status: DC

## 2016-10-10 NOTE — Progress Notes (Signed)
Patient: Jill Moon Female    DOB: 05-Nov-1990   25 y.o.   MRN: [id number] Visit Date: 10/10/2016  Today's Provider: Margaretann Loveless, PA-C   Chief Complaint  Patient presents with  . Diarrhea   Subjective:    HPI Diarrhea: Patient complains of diarrhea. Onset of diarrhea was several days ago. Diarrhea is occurring approximately 10 times per day. Patient describes diarrhea as yellow. Diarrhea has been associated with abdominal pain described as aching and vomiting x 5.  Patient denies recent international travel.  Previous visits for diarrhea: none. Evaluation to date: none. Patient reports going to the beach last week and ate a lot of sea food.      No Known Allergies  No current outpatient prescriptions on file.  Review of Systems  Constitutional: Negative.   HENT: Negative.   Respiratory: Negative.   Cardiovascular: Negative.   Gastrointestinal: Positive for abdominal distention, abdominal pain, diarrhea, nausea and vomiting. Negative for anal bleeding, blood in stool, constipation and rectal pain.  Genitourinary: Positive for menstrual problem (no menses month of July; does have PCOS) and pelvic pain. Negative for dysuria, flank pain, frequency, genital sores, hematuria, vaginal bleeding, vaginal discharge and vaginal pain.  Musculoskeletal: Negative.   Neurological: Negative.     Social History  Substance Use Topics  . Smoking status: Light Tobacco Smoker  . Smokeless tobacco: Never Used     Comment: Per patient she has not smoke for the past six months  . Alcohol use 0.6 oz/week    1 Shots of liquor per week     Comment: occasional   Objective:   BP 114/74 (BP Location: Left Arm, Patient Position: Sitting, Cuff Size: Large)   Pulse 84   Temp 97.9 F (36.6 C) (Oral)   Resp 16   Wt (!) 316 lb 9.6 oz (143.6 kg)   SpO2 98%   BMI 45.43 kg/m  Vitals:   10/10/16 0815  BP: 114/74  Pulse: 84  Resp: 16  Temp: 97.9 F (36.6 C)  TempSrc: Oral    SpO2: 98%  Weight: (!) 316 lb 9.6 oz (143.6 kg)     Physical Exam  Constitutional: She is oriented to person, place, and time. She appears well-developed and well-nourished. No distress.  Cardiovascular: Normal rate, regular rhythm and normal heart sounds.  Exam reveals no gallop and no friction rub.   No murmur heard. Pulmonary/Chest: Effort normal and breath sounds normal. No respiratory distress. She has no wheezes. She has no rales.  Abdominal: Soft. Normal appearance and bowel sounds are normal. She exhibits no distension and no mass. There is no hepatosplenomegaly. There is tenderness in the right lower quadrant, suprapubic area and left lower quadrant. There is no rebound, no guarding and no CVA tenderness.  Neurological: She is alert and oriented to person, place, and time.  Skin: Skin is warm and dry. She is not diaphoretic.       Assessment & Plan:     1. Nausea and vomiting, intractability of vomiting not specified, unspecified vomiting type Will check labs as below. DDx: gastroenteritis, IBS-D (history in mother), pregnancy, ovarian cyst (least likely). I will treat symptomatically with Bentyl and phenergan as below. She is to push fluids and rest. She is to call the office if symptoms persist greater than 10 days. If symptoms persist will get stool cultures.  - CBC w/Diff/Platelet - hCG, serum, qualitative - dicyclomine (BENTYL) 20 MG tablet; Take 1 tablet (20 mg total)  by mouth 4 (four) times daily -  before meals and at bedtime.  Dispense: 60 tablet; Refill: 0 - promethazine (PHENERGAN) 25 MG tablet; Take 1 tablet (25 mg total) by mouth every 8 (eight) hours as needed for nausea or vomiting.  Dispense: 20 tablet; Refill: 0  2. Diarrhea, unspecified type See above medical treatment plan. - CBC w/Diff/Platelet - hCG, serum, qualitative - dicyclomine (BENTYL) 20 MG tablet; Take 1 tablet (20 mg total) by mouth 4 (four) times daily -  before meals and at bedtime.  Dispense:  60 tablet; Refill: 0       Margaretann LovelessJennifer M Olander Friedl, PA-C  [facility]

## 2016-10-10 NOTE — Patient Instructions (Signed)

## 2016-10-11 LAB — CBC WITH DIFFERENTIAL/PLATELET
BASOS: 1 %
Basophils Absolute: 0.1 10*3/uL (ref 0.0–0.2)
EOS (ABSOLUTE): 0.3 10*3/uL (ref 0.0–0.4)
Eos: 4 %
Hematocrit: 39.5 % (ref 34.0–46.6)
Hemoglobin: 13 g/dL (ref 11.1–15.9)
IMMATURE GRANULOCYTES: 0 %
Immature Grans (Abs): 0 10*3/uL (ref 0.0–0.1)
LYMPHS ABS: 1.6 10*3/uL (ref 0.7–3.1)
Lymphs: 21 %
MCH: 26.3 pg — AB (ref 26.6–33.0)
MCHC: 32.9 g/dL (ref 31.5–35.7)
MCV: 80 fL (ref 79–97)
MONOS ABS: 0.6 10*3/uL (ref 0.1–0.9)
Monocytes: 8 %
NEUTROS PCT: 66 %
Neutrophils Absolute: 5 10*3/uL (ref 1.4–7.0)
PLATELETS: 293 10*3/uL (ref 150–379)
RBC: 4.94 x10E6/uL (ref 3.77–5.28)
RDW: 15.3 % (ref 12.3–15.4)
WBC: 7.5 10*3/uL (ref 3.4–10.8)

## 2016-10-11 LAB — HCG, SERUM, QUALITATIVE: hCG,Beta Subunit,Qual,Serum: NEGATIVE m[IU]/mL (ref <6)

## 2016-11-06 ENCOUNTER — Ambulatory Visit (INDEPENDENT_AMBULATORY_CARE_PROVIDER_SITE_OTHER): Payer: BLUE CROSS/BLUE SHIELD

## 2016-11-06 ENCOUNTER — Ambulatory Visit
Admission: EM | Admit: 2016-11-06 | Discharge: 2016-11-06 | Disposition: A | Discharge status: Home / Self Care | Payer: BLUE CROSS/BLUE SHIELD | Attending: Family Medicine | Admitting: Family Medicine

## 2016-11-06 ENCOUNTER — Encounter: Payer: Self-pay | Admitting: Emergency Medicine

## 2016-11-06 DIAGNOSIS — S39012A Strain of muscle, fascia and tendon of lower back, initial encounter: Secondary | ICD-10-CM | POA: Diagnosis not present

## 2016-11-06 DIAGNOSIS — M25571 Pain in right ankle and joints of right foot: Secondary | ICD-10-CM | POA: Diagnosis not present

## 2016-11-06 MED ORDER — METAXALONE 800 MG PO TABS: 800.0000 mg | ORAL_TABLET | Freq: Three times a day (TID) | ORAL | 0 refills | Status: DC

## 2016-11-06 MED ORDER — NAPROXEN 500 MG PO TABS: 500.0000 mg | ORAL_TABLET | Freq: Two times a day (BID) | ORAL | 0 refills | Status: DC

## 2016-11-06 NOTE — ED Triage Notes (Signed)
Patient c/o lower back pain that started yesterday.  Patient c/o right ankle pain for the past 2 weeks.  Patient denies fall or injury.  Patient denies N/V/D.

## 2016-11-06 NOTE — Discharge Instructions (Signed)
Use ice on your back injury and ankle 20 minutes out of every 2 hours 4-5 times daily. Avoid symptoms as much as possible. Do not sit for prolonged periods stand and walk around at least 5 minutes out of every half hour to 45 minutes.

## 2016-11-06 NOTE — ED Provider Notes (Signed)
[facility]    CSN: [id number] Arrival date & time: 11/06/16  4098     History   Chief Complaint Chief Complaint  Patient presents with  . Back Pain  . Ankle Pain    right    HPI Jill Moon is a 26 y.o. female.   HPI  This is a 26 year old female presents accompanied by her boyfriend. She states that yesterday she began having low back pain radiates to her posterior thigh to level of her knee on the right. She states that certain movements that tend to make it worse with bending or after sitting for prolonged period. Doesn't remember any injury. Has had no change in her activities of any significance. He does work at a computer all day long. His had kidney stones in the past and she states that this does not feel at all like one of her kidney stones.  Second problem is that of a of right ankle pain over the lateral malleolus and inferior but not involving the heel. She states that standing and walking seems to bother her the most. Her first steps in the morning or not bad but as the day progresses seems to worsen. Given she has had no injury. She does not remember any twisting injury all.         Past Medical History:  Diagnosis Date  . Depression   . Polycystic disease, ovaries     Patient Active Problem List   Diagnosis Date Noted  . S/P cholecystectomy 01/31/2016  . BMI 40.0-44.9, adult (HCC) 06/28/2015  . Morbid obesity due to excess calories (HCC) 06/28/2015  . Abortion, spontaneous 05/05/2015  . Clinical depression 05/05/2015  . Irregular menstrual cycle 05/05/2015    Past Surgical History:  Procedure Laterality Date  . CHOLECYSTECTOMY N/A 08/09/2015   Procedure: LAPAROSCOPIC CHOLECYSTECTOMY WITH INTRAOPERATIVE CHOLANGIOGRAM;  Surgeon: Tiney Rouge III, MD;  Location: [facility] ORS;  Service: General;  Laterality: N/A;  . ERCP N/A 08/11/2015   Procedure: ENDOSCOPIC RETROGRADE CHOLANGIOPANCREATOGRAPHY (ERCP);  Surgeon: Midge Minium, MD;  Location: [facility]  ENDOSCOPY;  Service: Endoscopy;  Laterality: N/A;  . ERCP N/A 11/29/2015   Procedure: ENDOSCOPIC RETROGRADE CHOLANGIOPANCREATOGRAPHY (ERCP) stent removal;  Surgeon: Midge Minium, MD;  Location: [facility] ENDOSCOPY;  Service: Endoscopy;  Laterality: N/A;  . TONSILLECTOMY AND ADENOIDECTOMY      OB History    No data available       Home Medications    Prior to Admission medications   Medication Sig Start Date End Date Taking? Authorizing Provider  metaxalone (SKELAXIN) 800 MG tablet Take 1 tablet (800 mg total) by mouth 3 (three) times daily. 11/06/16   Lutricia Feil, PA-C  naproxen (NAPROSYN) 500 MG tablet Take 1 tablet (500 mg total) by mouth 2 (two) times daily with a meal. 11/06/16   Lutricia Feil, PA-C    Family History Family History  Problem Relation Age of Onset  . Diabetes Mother        Type 2  . Hypertension Mother   . Hyperlipidemia Mother   . Kidney Stones Father   . Cancer Paternal Aunt        Ovarian  . Cancer Paternal Grandfather        Liver  . Hypertension Paternal Grandfather     Social History Social History  Substance Use Topics  . Smoking status: Former Games developer  . Smokeless tobacco: Never Used     Comment: Per patient she has not smoke for the past six  months  . Alcohol use 0.6 oz/week    1 Shots of liquor per week     Comment: occasional     Allergies   Patient has no known allergies.   Review of Systems Review of Systems  Constitutional: Positive for activity change. Negative for chills, fatigue and fever.  Musculoskeletal: Positive for back pain, gait problem and myalgias.  All other systems reviewed and are negative.    Physical Exam Triage Vital Signs ED Triage Vitals  Enc Vitals Group     BP 11/06/16 0859 119/74     Pulse Rate 11/06/16 0859 81     Resp 11/06/16 0859 16     Temp 11/06/16 0859 98 F (36.7 C)     Temp Source 11/06/16 0859 Oral     SpO2 11/06/16 0859 100 %     Weight 11/06/16 0856 300 lb (136.1 kg)     Height  11/06/16 0856 5\' 10"  (1.778 m)     Head Circumference --      Peak Flow --      Pain Score 11/06/16 0856 5     Pain Loc --      Pain Edu? --      Excl. in GC? --    No data found.   Updated Vital Signs BP 119/74 (BP Location: Left Arm)   Pulse 81   Temp 98 F (36.7 C) (Oral)   Resp 16   Ht 5\' 10"  (1.778 m)   Wt 300 lb (136.1 kg)   LMP 10/15/2016 Comment: denies preg  SpO2 100%   BMI 43.05 kg/m   Visual Acuity Right Eye Distance:   Left Eye Distance:   Bilateral Distance:    Right Eye Near:   Left Eye Near:    Bilateral Near:     Physical Exam  Constitutional: She appears well-developed and well-nourished. No distress.  HENT:  Head: Normocephalic.  Eyes: Pupils are equal, round, and reactive to light.  Neck: Normal range of motion.  Musculoskeletal:  Examination of the lumbar spine shows a morbidly obese female. She has a level pelvis in stance. Forward flexion is only able to flex approximately 15 without discomfort. She does have a rightward list. Returning to upright posture is difficult and painful. She has  discomfort in the lower lumbar paraspinous muscles and also along the right sacroiliac joint. Is able to toe and heel walk adequately. Sensation is intact to light touch in the lower extremities. DTRs are 1+ over 4 but symmetrical. EHL peroneal and anterior tibialis muscles are strong.Straight leg raise testing in the sitting position on the left is negative. On the right  she has positive findings at approximately 75-80 with right-sided low back pain.  Examination of the right ankle show some puffiness laterally. She has had no pain with compression of the tibia fibula distally. Is no ecchymosis or erythema present. Maximal tenderness is over the distal lateral malleolus. She also has tenderness inferiorly. She has no weightbearing tubercle tenderness. Compression of the heel was not painful. She has a good range of motion of dorsiflexion and plantar flexion of the  ankle.  Skin: She is not diaphoretic.  Nursing note and vitals reviewed.    UC Treatments / Results  Labs (all labs ordered are listed, but only abnormal results are displayed) Labs Reviewed - No data to display  EKG  EKG Interpretation None       Radiology Dg Ankle Complete Right  Result Date: 11/06/2016 CLINICAL DATA:  Painful  right ankle for 2 weeks.  Lateral pain. EXAM: RIGHT ANKLE - COMPLETE 3+ VIEW COMPARISON:  None. FINDINGS: There is a small bony fragment adjacent to the lateral talus likely reflecting sequela prior avulsive injury. There is no other acute fracture or dislocation. There is no evidence of arthropathy or other focal bone abnormality. Soft tissues are unremarkable. IMPRESSION: Small bony fragment adjacent to the lateral talus likely reflecting sequela prior avulsive injury. No other acute fracture or dislocation. Electronically Signed   By: Elige KoHetal  Patel   On: 11/06/2016 09:56    Procedures Procedures (including critical care time)  Medications Ordered in UC Medications - No data to display   Initial Impression / Assessment and Plan / UC Course  I have reviewed the triage vital signs and the nursing notes.  Pertinent labs & imaging results that were available during my care of the patient were reviewed by me and considered in my medical decision making (see chart for details).     Plan: 1. Test/x-ray results and diagnosis reviewed with patient 2. rx as per orders; risks, benefits, potential side effects reviewed with patient 3. Recommend supportive treatment with Rest and symptom avoidance. Use a ankle brace on the right ankle. You state that you have those at work and will be able to purchase one there. Do not sit for prolonged periods and I encourage you to get up and walk around every 30-45 minutes. No lifting bending pushing or pulling. Follow-up with her primary care physician next week if you are not improving. Use ice on the areas 20 minutes out of  every 2 hours 4-5 times daily for comfort. Cautioned regarding use of Skelaxin with activities that require concentration are judgment and do not drive to taking the medication 4. F/u prn if symptoms worsen or don't improve   Final Clinical Impressions(s) / UC Diagnoses   Final diagnoses:  Strain of lumbar region, initial encounter  Acute right ankle pain    New Prescriptions Discharge Medication List as of 11/06/2016 10:26 AM    START taking these medications   Details  metaxalone (SKELAXIN) 800 MG tablet Take 1 tablet (800 mg total) by mouth 3 (three) times daily., Starting Tue 11/06/2016, Normal    naproxen (NAPROSYN) 500 MG tablet Take 1 tablet (500 mg total) by mouth 2 (two) times daily with a meal., Starting Tue 11/06/2016, Normal         Controlled Substance Prescriptions [place] Controlled Substance Registry consulted? Not Applicable   Lutricia FeilRoemer, Katriana Dortch P, PA-C 11/06/16 1034

## 2017-01-30 ENCOUNTER — Encounter: Payer: Self-pay | Admitting: Obstetrics and Gynecology

## 2017-01-30 ENCOUNTER — Other Ambulatory Visit: Payer: Self-pay

## 2017-01-30 ENCOUNTER — Ambulatory Visit (INDEPENDENT_AMBULATORY_CARE_PROVIDER_SITE_OTHER): Payer: BLUE CROSS/BLUE SHIELD | Admitting: Obstetrics and Gynecology

## 2017-01-30 VITALS — BP 120/80 | HR 80 | Ht 69.0 in | Wt 327.0 lb

## 2017-01-30 DIAGNOSIS — N926 Irregular menstruation, unspecified: Secondary | ICD-10-CM

## 2017-01-30 DIAGNOSIS — E282 Polycystic ovarian syndrome: Secondary | ICD-10-CM | POA: Diagnosis not present

## 2017-01-30 DIAGNOSIS — N914 Secondary oligomenorrhea: Secondary | ICD-10-CM | POA: Diagnosis not present

## 2017-01-30 DIAGNOSIS — N938 Other specified abnormal uterine and vaginal bleeding: Secondary | ICD-10-CM | POA: Diagnosis not present

## 2017-01-30 HISTORY — DX: Irregular menstruation, unspecified: N92.6

## 2017-01-30 LAB — POCT URINE PREGNANCY: PREG TEST UR: NEGATIVE

## 2017-01-30 NOTE — Patient Instructions (Signed)
I value your feedback and entrusting us with your care. If you get a [facility] patient survey, I would appreciate you taking the time to let us know about your experience today. Thank you! 

## 2017-01-30 NOTE — Progress Notes (Signed)
Chief Complaint  Patient presents with  . Menstrual Problem    Abnormal Bleeding    HPI:      [name] is a 26 y.o. No obstetric history on file. who LMP was Patient's last menstrual period was 01/04/2017., presents today for irregular menses. Pt with hx of PCOS based on clinical sx of oligomenorrhea/amenorrhea with hirsutism. Never had labs in past because on OCPs. Pt stopped OCPs about a yr ago due to mood changes (was on desogen). Pt's menses Q1-4 months off OCPs, lasting 7 days, med flow with 1/2 dollar sized clots and dysmen, improved with NSAIDs. No BTB. Pt's menses started normally 01/04/17 but then continued to have spotting until yesterday. No bleeding today. Pt is frustrated with cycle unpredictability.  Pt considering conception and ok if it happens, but unsure if partner wants to pursue further pregnancy eval/mgmt. Pt is also in process of scheduling bariatric surg early 2019 if gets approved by insurance.    Past Medical History:  Diagnosis Date  . Depression   . Irregular periods/menstrual cycles 01/30/2017  . Polycystic disease, ovaries     Past Surgical History:  Procedure Laterality Date  . CHOLECYSTECTOMY N/A 08/09/2015   Procedure: LAPAROSCOPIC CHOLECYSTECTOMY WITH INTRAOPERATIVE CHOLANGIOGRAM;  Surgeon: Tiney Rougealph Ely III, MD;  Location: [facility] ORS;  Service: General;  Laterality: N/A;  . ERCP N/A 08/11/2015   Procedure: ENDOSCOPIC RETROGRADE CHOLANGIOPANCREATOGRAPHY (ERCP);  Surgeon: Midge Miniumarren Wohl, MD;  [facility];  Service: Endoscopy;  Laterality: N/A;  . ERCP N/A 11/29/2015   Procedure: ENDOSCOPIC RETROGRADE CHOLANGIOPANCREATOGRAPHY (ERCP) stent removal;  Surgeon: Midge Miniumarren Wohl, MD;  Location: [facility] ENDOSCOPY;  Service: Endoscopy;  Laterality: N/A;  . TONSILLECTOMY AND ADENOIDECTOMY      Family History  Problem Relation Age of Onset  . Diabetes Mother        Type 2  . Hypertension Mother   . Hyperlipidemia Mother   . Kidney Stones Father   .  Cancer Paternal Aunt        Ovarian  . Cancer Paternal Grandfather        Liver  . Hypertension Paternal Grandfather     Social History   Socioeconomic History  . Marital status: Single    Spouse name: Not on file  . Number of children: Not on file  . Years of education: Not on file  . Highest education level: Not on file  Social Needs  . Financial resource strain: Not on file  . Food insecurity - worry: Not on file  . Food insecurity - inability: Not on file  . Transportation needs - medical: Not on file  . Transportation needs - non-medical: Not on file  Occupational History  . Not on file  Tobacco Use  . Smoking status: Former Games developermoker  . Smokeless tobacco: Never Used  . Tobacco comment: Per patient she has not smoke for the past six months  Substance and Sexual Activity  . Alcohol use: Yes    Alcohol/week: 0.6 oz    Types: 1 Shots of liquor per week    Comment: occasional  . Drug use: No  . Sexual activity: Not on file  Other Topics Concern  . Not on file  Social History Narrative  . Not on file     Current Outpatient Medications:  .  metaxalone (SKELAXIN) 800 MG tablet, Take 1 tablet (800 mg total) by mouth 3 (three) times daily. (Patient not taking: Reported on 01/30/2017), Disp: 21 tablet, Rfl: 0 .  naproxen (  NAPROSYN) 500 MG tablet, Take 1 tablet (500 mg total) by mouth 2 (two) times daily with a meal. (Patient not taking: Reported on 01/30/2017), Disp: 60 tablet, Rfl: 0   ROS:  Review of Systems  Constitutional: Negative for fever.  Gastrointestinal: Positive for constipation and diarrhea. Negative for blood in stool, nausea and vomiting.  Genitourinary: Positive for menstrual problem and vaginal bleeding. Negative for dyspareunia, dysuria, flank pain, frequency, hematuria, urgency, vaginal discharge and vaginal pain.  Musculoskeletal: Negative for back pain.  Skin: Negative for rash.  Neurological: Positive for headaches.     OBJECTIVE:   Vitals:    BP 120/80 (BP Location: Left Arm, Patient Position: Sitting, Cuff Size: Large)   Pulse 80   Ht 5\' 9"  (1.753 m)   Wt (!) 327 lb (148.3 kg)   LMP 01/04/2017   BMI 48.29 kg/m   Physical Exam  Constitutional: She is oriented to person, place, and time and well-developed, well-nourished, and in no distress. Vital signs are normal.  Genitourinary: Vagina normal, uterus normal, cervix normal, right adnexa normal, left adnexa normal and vulva normal. Uterus is not enlarged. Cervix exhibits no motion tenderness and no tenderness. Right adnexum displays no mass and no tenderness. Left adnexum displays no mass and no tenderness. Vulva exhibits no erythema, no exudate, no lesion, no rash and no tenderness. Vagina exhibits no lesion.  Neurological: She is oriented to person, place, and time.  Vitals reviewed.   Results: Results for orders placed or performed in visit on 01/30/17 (from the past 24 hour(s))  POCT urine pregnancy     Status: Normal   Collection Time: 01/30/17 11:43 AM  Result Value Ref Range   Preg Test, Ur Negative Negative     Assessment/Plan: Secondary oligomenorrhea - Check PCOS labs. Hx of hirsutism and irreg menses.  - Plan: DHEA-sulfate, Estradiol, FSH/LH, Hemoglobin A1c, Progesterone, Prolactin, Testosterone,Free and Total, TSH, POCT urine pregnancy  PCOS (polycystic ovarian syndrome) - Plan: DHEA-sulfate, Estradiol, FSH/LH, Hemoglobin A1c, Progesterone, Prolactin, Testosterone,Free and Total, TSH  DUB (dysfunctional uterine bleeding) - Sx this month only. No bleeding now. Neg UPT. Check labs.   Pt unsure is she is having bariatric surg in a few months vs wanting fertility mgmt. Will check PCOS labs today since not done in past. Will f/u with results and can develop mgmt plan based on labs/pt pref. If going to do surg, should restart BC. If wants conception, will try metformin and see if cycles become normal.  Start PNVs.    Return in about 2 months (around 04/01/2017)  for annual/period f/u.  Lancelot Alyea B. Lakeyn Dokken, PA-C 01/30/2017 11:47 AM

## 2017-01-31 LAB — FSH/LH
FSH: 5.7 m[IU]/mL
LH: 8.4 m[IU]/mL

## 2017-01-31 LAB — DHEA-SULFATE: DHEA-SO4: 125.5 ug/dL (ref 84.8–378.0)

## 2017-01-31 LAB — TSH: TSH: 2.24 u[IU]/mL (ref 0.450–4.500)

## 2017-01-31 LAB — TESTOSTERONE,FREE AND TOTAL
TESTOSTERONE FREE: 1.9 pg/mL (ref 0.0–4.2)
TESTOSTERONE: 31 ng/dL (ref 8–48)

## 2017-01-31 LAB — PROLACTIN: Prolactin: 7.2 ng/mL (ref 4.8–23.3)

## 2017-01-31 LAB — HEMOGLOBIN A1C
ESTIMATED AVERAGE GLUCOSE: 111 mg/dL
Hgb A1c MFr Bld: 5.5 % (ref 4.8–5.6)

## 2017-01-31 LAB — ESTRADIOL: Estradiol: 58.6 pg/mL

## 2017-01-31 LAB — PROGESTERONE: Progesterone: <0.1 ng/mL

## 2017-02-05 ENCOUNTER — Telehealth: Payer: Self-pay | Admitting: Obstetrics and Gynecology

## 2017-02-05 DIAGNOSIS — N926 Irregular menstruation, unspecified: Secondary | ICD-10-CM

## 2017-02-05 MED ORDER — METFORMIN HCL 500 MG PO TABS: 500.0000 mg | ORAL_TABLET | Freq: Every day | ORAL | 1 refills | Status: DC

## 2017-02-05 NOTE — Telephone Encounter (Signed)
Pt aware of lab results for PCOS. Pt still with dx based on sx/hirsutism. Pt wants to conceive. Will try metformin 500 mg QHS to regulate cycles. Diet/wt loss. Has annual and f/u 1/19 anyway.  Pt still having DUB sx this month. Check u/s. Will call pt with results.

## 2017-02-14 ENCOUNTER — Ambulatory Visit (INDEPENDENT_AMBULATORY_CARE_PROVIDER_SITE_OTHER): Payer: BLUE CROSS/BLUE SHIELD

## 2017-02-14 DIAGNOSIS — N926 Irregular menstruation, unspecified: Secondary | ICD-10-CM

## 2017-02-14 NOTE — Telephone Encounter (Signed)
LMTRC. How is bleeding? If persists, will check EMB.

## 2017-02-15 ENCOUNTER — Other Ambulatory Visit: Payer: Self-pay | Admitting: Obstetrics and Gynecology

## 2017-02-15 MED ORDER — MEDROXYPROGESTERONE ACETATE 10 MG PO TABS: 10.0000 mg | ORAL_TABLET | Freq: Every day | ORAL | 0 refills | Status: DC

## 2017-02-15 NOTE — Telephone Encounter (Signed)
Pt aware of essentially neg u/s. Ovaries c/w PCOS dx. Pt still having DUB sx this mo. Rx provera QD for 7 days. If sx persist, will check EMB at 1/19 f/u appt.

## 2017-02-15 NOTE — Telephone Encounter (Signed)
Pt is returning Dr. Patsy [place]ager[place] missed call about her results. Please advise

## 2017-02-15 NOTE — Telephone Encounter (Signed)
Pt is returning call. Please advise  °

## 2017-02-15 NOTE — Telephone Encounter (Signed)
LMTRC

## 2017-02-15 NOTE — Progress Notes (Signed)
Rx provera due to DUB sx. U/S essentially neg. Try provera. If sx persist, will check EMB at 1/19 appt.

## 2017-02-27 ENCOUNTER — Telehealth: Payer: Self-pay

## 2017-02-27 NOTE — Telephone Encounter (Signed)
Pt was put on Metformin, hasn't seen any slowdown p 7d of hormones.  She stopped bleeding c those.  Since she stopped hormones bleeding came back very heavy and very unfortunate.  Thinks she'll go c bc option or something else if you have another option beside metformin.  [phone]

## 2017-02-28 ENCOUNTER — Ambulatory Visit: Payer: BLUE CROSS/BLUE SHIELD | Admitting: Obstetrics and Gynecology

## 2017-02-28 ENCOUNTER — Encounter: Payer: Self-pay | Admitting: Obstetrics and Gynecology

## 2017-02-28 VITALS — BP 120/70 | HR 73 | Ht 69.0 in | Wt 330.0 lb

## 2017-02-28 DIAGNOSIS — N938 Other specified abnormal uterine and vaginal bleeding: Secondary | ICD-10-CM | POA: Diagnosis not present

## 2017-02-28 MED ORDER — NORETHIN ACE-ETH ESTRAD-FE 1-20 MG-MCG(24) PO CAPS: 20.0000 mg | ORAL_CAPSULE | Freq: Every day | ORAL | 1 refills | Status: DC

## 2017-02-28 NOTE — Telephone Encounter (Signed)
Calling to follow up on message left. Please advise

## 2017-02-28 NOTE — Telephone Encounter (Signed)
Spoke with pt. Bleeding stopped for about 2 days with provera then restarted day after she completed Rx. Flow is heavy, with clots. RTO today for EMB and OCP start.

## 2017-02-28 NOTE — Progress Notes (Signed)
Chief Complaint  Patient presents with  . Biopsy    HPI:      [name] is a 26 y.o. No obstetric history on file. who LMP was No LMP recorded., presents today for EMB due to continued DUB after course of provera. Pt with hx of PCOS. Started with DUB 11/18 and hasn't stopped. EM on 12/18 u/s was 8 mm. Pt given provera course and bleeding stopped for about 2 days, but then restarted day after last pill and is fairly heavy, changing pads and tampons Q 1/12 hrs, with cramping, relieved with NSAIDs.  Neg UPT 01/30/17  Pt was undecided about conception vs bariatric surg when I saw her 11/18. Pt started metformin to see if would help with cycling for conception, but menses then never stopped.    Past Medical History:  Diagnosis Date  . Anxiety   . Boils   . Depression   . Irregular menses   . Irregular periods/menstrual cycles 01/30/2017  . Obesity   . Pap smear of vagina with ASC-US   . Polycystic disease, ovaries     Past Surgical History:  Procedure Laterality Date  . CHOLECYSTECTOMY N/A 08/09/2015   Procedure: LAPAROSCOPIC CHOLECYSTECTOMY WITH INTRAOPERATIVE CHOLANGIOGRAM;  Surgeon: Tiney Rougealph Ely III, MD;  Location: [facility] ORS;  Service: General;  Laterality: N/A;  . ERCP N/A 08/11/2015   Procedure: ENDOSCOPIC RETROGRADE CHOLANGIOPANCREATOGRAPHY (ERCP);  Surgeon: Midge Miniumarren Wohl, MD;  [facility];  Service: Endoscopy;  Laterality: N/A;  . ERCP N/A 11/29/2015   Procedure: ENDOSCOPIC RETROGRADE CHOLANGIOPANCREATOGRAPHY (ERCP) stent removal;  Surgeon: Midge Miniumarren Wohl, MD;  Location: [facility] ENDOSCOPY;  Service: Endoscopy;  Laterality: N/A;  . TONSILLECTOMY AND ADENOIDECTOMY      Family History  Problem Relation Age of Onset  . Diabetes Mother        Type 2  . Hypertension Mother   . Hyperlipidemia Mother   . Kidney Stones Father   . Cancer Paternal Aunt        Ovarian  . Cancer Paternal Grandfather        Liver  . Hypertension Paternal Grandfather     Social History     Socioeconomic History  . Marital status: Single    Spouse name: Not on file  . Number of children: Not on file  . Years of education: Not on file  . Highest education level: Not on file  Social Needs  . Financial resource strain: Not on file  . Food insecurity - worry: Not on file  . Food insecurity - inability: Not on file  . Transportation needs - medical: Not on file  . Transportation needs - non-medical: Not on file  Occupational History  . Not on file  Tobacco Use  . Smoking status: Former Games developermoker  . Smokeless tobacco: Never Used  . Tobacco comment: Per patient she has not smoke for the past six months  Substance and Sexual Activity  . Alcohol use: Yes    Alcohol/week: 0.6 oz    Types: 1 Shots of liquor per week    Comment: occasional  . Drug use: No  . Sexual activity: Not on file  Other Topics Concern  . Not on file  Social History Narrative  . Not on file     Current Outpatient Medications:  .  medroxyPROGESTERone (PROVERA) 10 MG tablet, Take 1 tablet (10 mg total) by mouth daily for 7 days., Disp: 7 tablet, Rfl: 0 .  metaxalone (SKELAXIN) 800 MG tablet, Take 1 tablet (800  mg total) by mouth 3 (three) times daily. (Patient not taking: Reported on 01/30/2017), Disp: 21 tablet, Rfl: 0 .  metFORMIN (GLUCOPHAGE) 500 MG tablet, Take 1 tablet (500 mg total) by mouth daily with supper., Disp: 30 tablet, Rfl: 1 .  naproxen (NAPROSYN) 500 MG tablet, Take 1 tablet (500 mg total) by mouth 2 (two) times daily with a meal. (Patient not taking: Reported on 01/30/2017), Disp: 60 tablet, Rfl: 0 .  Norethin Ace-Eth Estrad-FE (TAYTULLA) 1-20 MG-MCG(24) CAPS, Take 20 mg by mouth daily. 2 SAMPLES GIVEN, Disp: 28 capsule, Rfl: 1   ROS:  Review of Systems  Constitutional: Negative for fever.  Gastrointestinal: Negative for blood in stool, constipation, diarrhea, nausea and vomiting.  Genitourinary: Positive for menstrual problem. Negative for dyspareunia, dysuria, flank pain,  frequency, hematuria, urgency, vaginal bleeding, vaginal discharge and vaginal pain.  Musculoskeletal: Negative for back pain.  Skin: Negative for rash.     OBJECTIVE:   Vitals:  BP 120/70   Pulse 73   Ht 5\' 9"  (1.753 m)   Wt (!) 330 lb (149.7 kg)   BMI 48.73 kg/m   Physical Exam  Constitutional: She is oriented to person, place, and time and well-developed, well-nourished, and in no distress.  Genitourinary: Vagina normal and cervix normal. Cervix exhibits no lesion and no tenderness. Vulva exhibits no erythema, no exudate, no lesion and no rash. Vagina exhibits no lesion.  Neurological: She is alert and oriented to person, place, and time.  Psychiatric: Memory, affect and judgment normal.   EMB attempted but cx os not penetrable. Pt in severe pain, too.    Assessment/Plan: DUB (dysfunctional uterine bleeding) - EMB attempted but failed. Try OCPs tapered dose. 2 samples Taytulla. 3 tabs for 3 days, 2 tabs for 2 days, then 1 tab daily. F/u prn. If persist, see MD - Plan: Norethin Ace-Eth Estrad-FE (TAYTULLA) 1-20 MG-MCG(24) CAPS   Meds ordered this encounter  Medications  . Norethin Ace-Eth Estrad-FE (TAYTULLA) 1-20 MG-MCG(24) CAPS    Sig: Take 20 mg by mouth daily. 2 SAMPLES GIVEN    Dispense:  28 capsule    Refill:  1     Return in about 1 month (around 03/31/2017) for ANNUAL/F/U.  Mousa Prout B. Joshuan Bolander, PA-C 02/28/2017 5:24 PM

## 2017-02-28 NOTE — Patient Instructions (Signed)
I value your feedback and entrusting us with your care. If you get a [facility] patient survey, I would appreciate you taking the time to let us know about your experience today. Thank you! 

## 2017-03-04 ENCOUNTER — Telehealth: Payer: Self-pay | Admitting: Obstetrics and Gynecology

## 2017-03-04 NOTE — Telephone Encounter (Signed)
Spoke with pt. Bleeding improved with OCP use. F/u at 04/02/17 appt/sooner prn.

## 2017-04-02 ENCOUNTER — Ambulatory Visit (INDEPENDENT_AMBULATORY_CARE_PROVIDER_SITE_OTHER): Payer: BLUE CROSS/BLUE SHIELD | Admitting: Obstetrics and Gynecology

## 2017-04-02 ENCOUNTER — Encounter: Payer: Self-pay | Admitting: Obstetrics and Gynecology

## 2017-04-02 VITALS — BP 126/88 | HR 92 | Ht 69.0 in | Wt 326.0 lb

## 2017-04-02 DIAGNOSIS — N938 Other specified abnormal uterine and vaginal bleeding: Secondary | ICD-10-CM | POA: Diagnosis not present

## 2017-04-02 MED ORDER — NORETHIN ACE-ETH ESTRAD-FE 1-20 MG-MCG(24) PO CAPS: 20.0000 mg | ORAL_CAPSULE | Freq: Every day | ORAL | 1 refills | Status: DC

## 2017-04-02 NOTE — Patient Instructions (Signed)
I value your feedback and entrusting us with your care. If you get a [facility] patient survey, I would appreciate you taking the time to let us know about your experience today. Thank you! 

## 2017-04-02 NOTE — Progress Notes (Signed)
Chief Complaint  Patient presents with  . Follow-up    irregular cycles/heavy bleeding    HPI:      [name] is a 26 y.o. No obstetric history on file. who LMP was Patient's last menstrual period was 03/26/2017., presents today for f/u of DUB 12/18. Hx of PCOS with menses Q1-4 months off OCPs last yr. Periods lasted 7 days, med flow with clots and dysmen. No DUB sx till 11/18 when period didn't stop. Neg UPT. Pt had neg labs and 12/18 u/s showed EM=8.38 mm. EMB attempted unsuccessfully. Pt given provera without bleeding relief, so started on taytulla taper and then daily OCPs. Bleeding stopped while on higher dose of taper but then started light spotting for several wks. Now having regular period flow again, changing pads/tampons Q2 hrs with 1/2 dollar szied clots. On 3rd wk of pill pack.  No side effects with OCPs.  Had discussed conception vs bariatric surg 1/19. Pt decided to restart OCPs for cycle control for now and then make decision on next step.   Was supposed to do annual today but bleeding heavy today and wants to defer.  Past Medical History:  Diagnosis Date  . Anxiety   . Boils   . BRCA negative 2017  . Depression   . Hirsutism   . Irregular menses   . Irregular periods/menstrual cycles 01/30/2017  . Obesity   . Pap smear of vagina with ASC-US   . Polycystic disease, ovaries     Past Surgical History:  Procedure Laterality Date  . CHOLECYSTECTOMY N/A 08/09/2015   Procedure: LAPAROSCOPIC CHOLECYSTECTOMY WITH INTRAOPERATIVE CHOLANGIOGRAM;  Surgeon: Dia Crawford III, MD;  Location: [facility] ORS;  Service: General;  Laterality: N/A;  . ERCP N/A 08/11/2015   Procedure: ENDOSCOPIC RETROGRADE CHOLANGIOPANCREATOGRAPHY (ERCP);  Surgeon: Lucilla Lame, MD;  Location: [facility]COPY;  Service: Endoscopy;  Laterality: N/A;  . ERCP N/A 11/29/2015   Procedure: ENDOSCOPIC RETROGRADE CHOLANGIOPANCREATOGRAPHY (ERCP) stent removal;  Surgeon: Lucilla Lame, MD;  Location: [facility] ENDOSCOPY;   Service: Endoscopy;  Laterality: N/A;  . TONSILLECTOMY AND ADENOIDECTOMY      Family History  Problem Relation Age of Onset  . Diabetes Mother        Type 2  . Hypertension Mother   . Hyperlipidemia Mother   . Kidney Stones Father   . Cancer Paternal Aunt        Ovarian  . Cancer Paternal Grandfather        Liver  . Hypertension Paternal Grandfather     Social History   Socioeconomic History  . Marital status: Single    Spouse name: Not on file  . Number of children: Not on file  . Years of education: Not on file  . Highest education level: Not on file  Social Needs  . Financial resource strain: Not on file  . Food insecurity - worry: Not on file  . Food insecurity - inability: Not on file  . Transportation needs - medical: Not on file  . Transportation needs - non-medical: Not on file  Occupational History  . Not on file  Tobacco Use  . Smoking status: Former Research scientist (life sciences)  . Smokeless tobacco: Never Used  . Tobacco comment: Per patient she has not smoke for the past six months  Substance and Sexual Activity  . Alcohol use: Yes    Alcohol/week: 0.6 oz    Types: 1 Shots of liquor per week    Comment: occasional  . Drug use: No  .  Sexual activity: Not on file  Other Topics Concern  . Not on file  Social History Narrative  . Not on file     Current Outpatient Medications:  .  Norethin Ace-Eth Estrad-FE (TAYTULLA) 1-20 MG-MCG(24) CAPS, Take 20 mg by mouth daily. 2 SAMPLES GIVEN, Disp: 28 capsule, Rfl: 1   ROS:  Review of Systems  Constitutional: Negative for fever.  Gastrointestinal: Negative for blood in stool, constipation, diarrhea, nausea and vomiting.  Genitourinary: Positive for menstrual problem. Negative for dyspareunia, dysuria, flank pain, frequency, hematuria, urgency, vaginal bleeding, vaginal discharge and vaginal pain.  Musculoskeletal: Negative for back pain.  Skin: Negative for rash.     OBJECTIVE:   Vitals:  BP 126/88 (BP Location: Left  Arm, Patient Position: Sitting, Cuff Size: Large)   Pulse 92   Ht _0  (1.753 m)   Wt (!) 326 lb (147.9 kg)   LMP 03/26/2017   BMI 48.14 kg/m   Physical Exam  Constitutional: She is oriented to person, place, and time and well-developed, well-nourished, and in no distress.  Neurological: She is alert and oriented to person, place, and time.  Psychiatric: Memory, affect and judgment normal.  Vitals reviewed.   Assessment/Plan: DUB (dysfunctional uterine bleeding) - Neg eval, except EMB unsuccessful. Trying to control sx with OCPs. Been on them for 1 mo. 2 samples taytulla given to pt.  - Plan: Norethin Ace-Eth Estrad-FE (TAYTULLA) 1-20 MG-MCG(24) CAPS  If sx persist, will refer to MD for further mgmt.  Pt to call to reschedule annual when bleeding stops/lessens, per her pref.   Meds ordered this encounter  Medications  . Norethin Ace-Eth Estrad-FE (TAYTULLA) 1-20 MG-MCG(24) CAPS    Sig: Take 20 mg by mouth daily. 2 SAMPLES GIVEN    Dispense:  28 capsule    Refill:  1      Return if symptoms worsen or fail to improve.  Malaquias Lenker B. Roseann Kees, PA-C 04/02/2017 9:08 AM

## 2017-04-09 ENCOUNTER — Encounter: Payer: Self-pay | Admitting: Physician Assistant

## 2017-04-09 ENCOUNTER — Ambulatory Visit: Payer: BLUE CROSS/BLUE SHIELD | Admitting: Physician Assistant

## 2017-04-09 VITALS — BP 116/80 | HR 86 | Temp 98.3°F | Resp 16 | Wt 325.0 lb

## 2017-04-09 DIAGNOSIS — J069 Acute upper respiratory infection, unspecified: Secondary | ICD-10-CM | POA: Diagnosis not present

## 2017-04-09 DIAGNOSIS — B9789 Other viral agents as the cause of diseases classified elsewhere: Secondary | ICD-10-CM | POA: Diagnosis not present

## 2017-04-09 MED ORDER — BENZONATATE 100 MG PO CAPS: 100.0000 mg | ORAL_CAPSULE | Freq: Three times a day (TID) | ORAL | 0 refills | Status: AC | PRN

## 2017-04-09 NOTE — Patient Instructions (Signed)

## 2017-04-09 NOTE — Progress Notes (Signed)
[facility]  Chief Complaint  Patient presents with  . URI    Started 3 days ago    Subjective:    Patient ID: Jill Moon, female    DOB: [birth year], 27 y.o.   MRN: [id number]  Upper Respiratory Infection: Jill Moon is a  27 y.o. female complaining of symptoms of a URI, possible sinusitis. Symptoms include bilateral ear pain, congestion, cough and sore throat. Onset of symptoms was 3 days ago, gradually worsening since that time. She also c/o bilateral ear pressure/pain, achiness, congestion, nasal congestion, shortness of breath, sinus pressure and sore throat for the past 3 days .  She is drinking plenty of fluids. Evaluation to date: none. Treatment to date: cough suppressants and decongestants. The treatment has provided no.   Review of Systems  Constitutional: Positive for chills and fatigue. Negative for activity change, appetite change, diaphoresis, fever and unexpected weight change.  HENT: Positive for congestion, ear pain, postnasal drip, rhinorrhea, sinus pressure, sinus pain and sore throat. Negative for ear discharge, hearing loss, tinnitus, trouble swallowing and voice change.   Eyes: Negative.   Respiratory: Positive for cough, chest tightness and shortness of breath. Negative for apnea, choking, wheezing and stridor.   Gastrointestinal: Positive for diarrhea. Negative for abdominal distention, abdominal pain, anal bleeding, blood in stool, constipation, nausea, rectal pain and vomiting.  Musculoskeletal: Negative for neck pain and neck stiffness.  Neurological: Positive for light-headedness and headaches. Negative for dizziness.  Hematological: Negative for adenopathy.       Objective:   BP 116/80 (BP Location: Right Arm, Patient Position: Sitting, Cuff Size: Large)   Pulse 86   Temp 98.3 F (36.8 C) (Oral)   Resp 16   Wt (!) 325 lb (147.4 kg)   LMP 03/26/2017   SpO2 97%   BMI 47.99 kg/m   Patient Active  Problem List   Diagnosis Date Noted  . DUB (dysfunctional uterine bleeding) 01/30/2017  . S/P cholecystectomy 01/31/2016  . BMI 40.0-44.9, adult (HCC) 06/28/2015  . Morbid obesity due to excess calories (HCC) 06/28/2015  . Abortion, spontaneous 05/05/2015  . Clinical depression 05/05/2015  . Irregular menstrual cycle 05/05/2015    Outpatient Encounter Medications as of 04/09/2017  Medication Sig  . Norethin Ace-Eth Estrad-FE (TAYTULLA) 1-20 MG-MCG(24) CAPS Take 20 mg by mouth daily. 2 SAMPLES GIVEN   No facility-administered encounter medications on file as of 04/09/2017.     No Known Allergies     Physical Exam  Constitutional: She is oriented to person, place, and time. She appears well-developed and well-nourished.  HENT:  Right Ear: External ear normal.  Left Ear: External ear normal.  Mouth/Throat: Oropharynx is clear and moist. No oropharyngeal exudate.  Eyes: Right eye exhibits discharge. Left eye exhibits discharge.  Neck: Neck supple.  Cardiovascular: Normal rate and regular rhythm.  Pulmonary/Chest: Effort normal and breath sounds normal. No respiratory distress. She has no wheezes. She has no rales.  Lymphadenopathy:    She has no cervical adenopathy.  Neurological: She is alert and oriented to person, place, and time.  Skin: Skin is warm and dry.  Psychiatric: She has a normal mood and affect. Her behavior is normal.       Assessment & Plan:  1. Viral URI with cough  Sx trx counseled with mucinex, fluids. Does not want Tamiflu, did not test. Work note provided.   - benzonatate (TESSALON PERLES) 100 MG capsule; Take 1 capsule (100 mg total) by mouth 3 (  three) times daily as needed for up to 7 days.  Dispense: 21 capsule; Refill: 0  Return if symptoms worsen or fail to improve.   The entirety of the information documented in the History of Present Illness, Review of Systems and Physical Exam were personally obtained by me. Portions of this information were  initially documented by Kavin LeechLaura Walsh, CMA and reviewed by me for thoroughness and accuracy.

## 2017-05-31 ENCOUNTER — Ambulatory Visit: Payer: BLUE CROSS/BLUE SHIELD | Admitting: Physician Assistant

## 2017-05-31 ENCOUNTER — Encounter: Payer: Self-pay | Admitting: Physician Assistant

## 2017-05-31 VITALS — BP 110/60 | HR 88 | Temp 98.2°F | Resp 16 | Ht 69.0 in | Wt 322.8 lb

## 2017-05-31 DIAGNOSIS — Z713 Dietary counseling and surveillance: Secondary | ICD-10-CM

## 2017-05-31 DIAGNOSIS — Z6841 Body Mass Index (BMI) 40.0 and over, adult: Secondary | ICD-10-CM

## 2017-05-31 MED ORDER — PHENTERMINE HCL 37.5 MG PO TABS: 37.5000 mg | ORAL_TABLET | Freq: Every day | ORAL | 0 refills | Status: DC

## 2017-05-31 NOTE — Progress Notes (Signed)
       Patient: Jill Moon Female    DOB: 01/23/1991   27 y.o.   MRN: [id number] Visit Date: 05/31/2017  Today's Provider: Margaretann LovelessJennifer M Melessia Kaus, PA-C   Chief Complaint  Patient presents with  . Obesity   Subjective:    HPI Patient here today for Obesity. Patient reports she wants to get back on Phentermine. Reports she started back exercising. She is currently exercising 3 times a week on a bike. Reports she has also been with weight watchers for a month.  Wt Readings from Last 3 Encounters:  05/31/17 (!) 322 lb 12.8 oz (146.4 kg)  04/09/17 (!) 325 lb (147.4 kg)  04/02/17 (!) 326 lb (147.9 kg)      No Known Allergies   Current Outpatient Medications:  .  Norethin Ace-Eth Estrad-FE (TAYTULLA) 1-20 MG-MCG(24) CAPS, Take 20 mg by mouth daily. 2 SAMPLES GIVEN, Disp: 28 capsule, Rfl: 1  Review of Systems  Constitutional: Negative.   Respiratory: Negative.   Cardiovascular: Negative.   Gastrointestinal: Negative.   Psychiatric/Behavioral: Negative.     Social History   Tobacco Use  . Smoking status: Former Games developermoker  . Smokeless tobacco: Never Used  . Tobacco comment: Per patient she has not smoke for the past six months  Substance Use Topics  . Alcohol use: Yes    Alcohol/week: 0.6 oz    Types: 1 Shots of liquor per week    Comment: occasional   Objective:   BP 110/60 (BP Location: Left Wrist, Patient Position: Sitting, Cuff Size: Normal)   Pulse 88   Temp 98.2 F (36.8 C) (Oral)   Resp 16   Ht 5\' 9"  (1.753 m)   Wt (!) 322 lb 12.8 oz (146.4 kg)   BMI 47.67 kg/m  Vitals:   05/31/17 1052  BP: 110/60  Pulse: 88  Resp: 16  Temp: 98.2 F (36.8 C)  TempSrc: Oral  Weight: (!) 322 lb 12.8 oz (146.4 kg)  Height: 5\' 9"  (1.753 m)     Physical Exam  Constitutional: She appears well-developed and well-nourished. No distress.  Neck: Normal range of motion. Neck supple.  Cardiovascular: Normal rate, regular rhythm and normal heart sounds. Exam reveals no  gallop and no friction rub.  No murmur heard. Pulmonary/Chest: Effort normal and breath sounds normal. No respiratory distress. She has no wheezes. She has no rales.  Skin: She is not diaphoretic.  Vitals reviewed.      Assessment & Plan:     1. Encounter for weight loss counseling Has lost 4 pounds on her own doing weight watchers over the last month. Will start phentermine as below. Has tolerated in the past. I will see her back in 4 weeks to recheck weight and see how she is tolerating the medication.  - phentermine (ADIPEX-P) 37.5 MG tablet; Take 1 tablet (37.5 mg total) by mouth daily before breakfast.  Dispense: 30 tablet; Refill: 0  2. BMI 45.0-49.9, adult [facility](HCC) See above medical treatment plan. - phentermine (ADIPEX-P) 37.5 MG tablet; Take 1 tablet (37.5 mg total) by mouth daily before breakfast.  Dispense: 30 tablet; Refill: 0       Margaretann LovelessJennifer M Jaskirat Zertuche, PA-C  [facility]

## 2017-06-13 ENCOUNTER — Ambulatory Visit: Payer: BLUE CROSS/BLUE SHIELD | Admitting: Physician Assistant

## 2017-06-13 ENCOUNTER — Encounter: Payer: Self-pay | Admitting: Physician Assistant

## 2017-06-13 VITALS — BP 118/64 | Temp 98.1°F | Resp 16 | Ht 69.0 in | Wt 321.0 lb

## 2017-06-13 DIAGNOSIS — G44209 Tension-type headache, unspecified, not intractable: Secondary | ICD-10-CM | POA: Diagnosis not present

## 2017-06-13 DIAGNOSIS — R51 Headache: Secondary | ICD-10-CM

## 2017-06-13 DIAGNOSIS — R110 Nausea: Secondary | ICD-10-CM | POA: Diagnosis not present

## 2017-06-13 DIAGNOSIS — R519 Headache, unspecified: Secondary | ICD-10-CM

## 2017-06-13 DIAGNOSIS — K529 Noninfective gastroenteritis and colitis, unspecified: Secondary | ICD-10-CM

## 2017-06-13 MED ORDER — ONDANSETRON HCL 4 MG PO TABS: 4.0000 mg | ORAL_TABLET | Freq: Three times a day (TID) | ORAL | 0 refills | Status: DC | PRN

## 2017-06-13 MED ORDER — FLUTICASONE PROPIONATE 50 MCG/ACT NA SUSP: 2.0000 | Freq: Every day | NASAL | 6 refills | Status: DC

## 2017-06-13 NOTE — Patient Instructions (Signed)
Tension Headache A tension headache is pain, pressure, or aching that is felt over the front and sides of your head. These headaches can last from 30 minutes to several days. Follow these instructions at home: Managing pain  Take over-the-counter and prescription medicines only as told by your doctor.  Lie down in a dark, quiet room when you have a headache.  If directed, apply ice to your head and neck area: ? Put ice in a plastic bag. ? Place a towel between your skin and the bag. ? Leave the ice on for 20 minutes, 2-3 times per day.  Use a heating pad or a hot shower to apply heat to your head and neck area as told by your doctor. Eating and drinking  Eat meals on a regular schedule.  Do not drink a lot of alcohol.  Do not use a lot of caffeine, or stop using caffeine. General instructions  Keep all follow-up visits as told by your doctor. This is important.  Keep a journal to find out if certain things bring on headaches. For example, write down: ? What you eat and drink. ? How much sleep you get. ? Any change to your diet or medicines.  Try getting a massage, or doing other things that help you to relax.  Lessen stress.  Sit up straight. Do not tighten (tense) your muscles.  Do not use tobacco products. This includes cigarettes, chewing tobacco, or e-cigarettes. If you need help quitting, ask your doctor.  Exercise regularly as told by your doctor.  Get enough sleep. This may mean 7-9 hours of sleep. Contact a doctor if:  Your symptoms are not helped by medicine.  You have a headache that feels different from your usual headache.  You feel sick to your stomach (nauseous) or you throw up (vomit).  You have a fever. Get help right away if:  Your headache becomes very bad.  You keep throwing up.  You have a stiff neck.  You have trouble seeing.  You have trouble speaking.  You have pain in your eye or ear.  Your muscles are weak or you lose muscle  control.  You lose your balance or you have trouble walking.  You feel like you will pass out (faint) or you pass out.  You have confusion. This information is not intended to replace advice given to you by your health care provider. Make sure you discuss any questions you have with your health care provider. Document Released: 05/16/2009 Document Revised: 10/20/2015 Document Reviewed: 06/14/2014 Elsevier Interactive Patient Education  2018 [employer]vier Inc[employer]  

## 2017-06-13 NOTE — Progress Notes (Signed)
Patient: Jill Moon Female    DOB: 03/26/1990   27 y.o.   MRN: [id number] Visit Date: 06/13/2017  Today's Provider: Margaretann LovelessJennifer M Damisha Wolff, PA-C   Chief Complaint  Patient presents with  . Headache   Subjective:    HPI Patient comes in today c/o nausea, dizziness, and headaches. She reports that it all came on on Sunday. She has taken Excedrin and Ibuprofen with no relief. She feels that it could be this could sinus related.    Symptoms started on Monday with a headache in the back of her neck and head. Then on Tuesday she awoke with nausea, vomiting and diarrhea. Nausea and diarrhea have persisted since then. No vomiting since Tuesday. She is also having some mild dizziness just this morning, "had to hold on to the wall this morning." She is also having some sinus pressure starting as well and ear pain (L>R). She has only used IBU and tylenol for symptoms with mild relief. She was around a sick contact on Saturday (her nephew had GI symptoms).     No Known Allergies   Current Outpatient Medications:  .  Norethin Ace-Eth Estrad-FE (TAYTULLA) 1-20 MG-MCG(24) CAPS, Take 20 mg by mouth daily. 2 SAMPLES GIVEN, Disp: 28 capsule, Rfl: 1 .  phentermine (ADIPEX-P) 37.5 MG tablet, Take 1 tablet (37.5 mg total) by mouth daily before breakfast., Disp: 30 tablet, Rfl: 0  Review of Systems  Constitutional: Positive for fatigue.  HENT: Positive for congestion, ear pain, postnasal drip, sinus pressure and sore throat. Negative for ear discharge, nosebleeds, rhinorrhea, sinus pain, tinnitus and trouble swallowing.   Respiratory: Positive for cough. Negative for chest tightness, shortness of breath and wheezing.   Cardiovascular: Negative for chest pain, palpitations and leg swelling.  Gastrointestinal: Positive for abdominal pain, diarrhea, nausea and vomiting (only tuesday). Negative for abdominal distention.  Neurological: Positive for dizziness, light-headedness and headaches.     Social History   Tobacco Use  . Smoking status: Former Games developermoker  . Smokeless tobacco: Never Used  . Tobacco comment: Per patient she has not smoke for the past six months  Substance Use Topics  . Alcohol use: Yes    Alcohol/week: 0.6 oz    Types: 1 Shots of liquor per week    Comment: occasional   Objective:   BP 118/64 (BP Location: Left Arm, Patient Position: Sitting, Cuff Size: Large)   Temp 98.1 F (36.7 C)   Resp 16   Wt (!) 321 lb (145.6 kg)   BMI 47.40 kg/m  Vitals:   06/13/17 0930  BP: 118/64  Resp: 16  Temp: 98.1 F (36.7 C)  Weight: (!) 321 lb (145.6 kg)     Physical Exam  Constitutional: She appears well-developed and well-nourished. No distress.  HENT:  Head: Normocephalic and atraumatic.  Right Ear: Hearing, external ear and ear canal normal. A middle ear effusion is present.  Left Ear: Hearing, tympanic membrane, external ear and ear canal normal.  Nose: Right sinus exhibits frontal sinus tenderness. Right sinus exhibits no maxillary sinus tenderness. Left sinus exhibits frontal sinus tenderness. Left sinus exhibits no maxillary sinus tenderness.  Mouth/Throat: Uvula is midline, oropharynx is clear and moist and mucous membranes are normal. No oropharyngeal exudate.  Neck: Normal range of motion. Neck supple. No tracheal deviation present. No thyromegaly present.  Cardiovascular: Normal rate, regular rhythm and normal heart sounds. Exam reveals no gallop and no friction rub.  No murmur heard. Pulmonary/Chest: Effort normal and breath  sounds normal. No stridor. No respiratory distress. She has no wheezes. She has no rales.  Abdominal: Soft. Bowel sounds are normal. There is no tenderness.  Lymphadenopathy:    She has no cervical adenopathy.  Skin: She is not diaphoretic.  Vitals reviewed.      Assessment & Plan:     1. Nausea Suspect viral gastroenteritis. Zofran given for symptom control. Push fluids. Call if no improvement. - ondansetron  (ZOFRAN) 4 MG tablet; Take 1 tablet (4 mg total) by mouth every 8 (eight) hours as needed for nausea or vomiting.  Dispense: 20 tablet; Refill: 0  2. Gastroenteritis See above medical treatment plan. - ondansetron (ZOFRAN) 4 MG tablet; Take 1 tablet (4 mg total) by mouth every 8 (eight) hours as needed for nausea or vomiting.  Dispense: 20 tablet; Refill: 0  3. Sinus headache Worsening. Advised to add flonase and sudafed. Call if symptoms worsen.  - fluticasone (FLONASE) 50 MCG/ACT nasal spray; Place 2 sprays into both nostrils daily.  Dispense: 16 g; Refill: 6  4. Tension headache Discussed conservative measures with massage, heat, relaxation techniques and making sure to have a good pillow with support for sleep. Also discussed making sure computer at work is of appropriate ergonomic positioning to avoid pressure on neck muscles. She is to call if symptoms worsen or change.        Margaretann Loveless, PA-C  [facility]

## 2017-07-01 ENCOUNTER — Ambulatory Visit: Payer: Self-pay | Admitting: Physician Assistant

## 2017-07-04 ENCOUNTER — Ambulatory Visit: Payer: BLUE CROSS/BLUE SHIELD | Admitting: Physician Assistant

## 2017-07-04 ENCOUNTER — Encounter: Payer: Self-pay | Admitting: Physician Assistant

## 2017-07-04 VITALS — BP 116/70 | HR 76 | Temp 98.3°F | Resp 16 | Ht 69.0 in | Wt 325.0 lb

## 2017-07-04 DIAGNOSIS — H1032 Unspecified acute conjunctivitis, left eye: Secondary | ICD-10-CM | POA: Diagnosis not present

## 2017-07-04 MED ORDER — OFLOXACIN 0.3 % OP SOLN: 1.0000 [drp] | Freq: Four times a day (QID) | OPHTHALMIC | 0 refills | Status: AC

## 2017-07-04 NOTE — Progress Notes (Signed)
Patient: Jill Moon Female    DOB: 1990-10-11   27 y.o.   MRN: [id number] Visit Date: 07/04/2017  Today's Provider: Trey Sailors, PA-C   Chief Complaint  Patient presents with  . Eye Pain   Subjective:    [name] is a 27 y/o woman with exposure to pink eye via her boyfriend presenting today for left eye pain x 1 day.  Eye Pain   The left eye is affected. This is a new problem. The current episode started yesterday. The problem has been gradually worsening. Associated symptoms include blurred vision, an eye discharge, eye redness and itching. She has tried nothing for the symptoms.       No Known Allergies   Current Outpatient Medications:  .  fluticasone (FLONASE) 50 MCG/ACT nasal spray, Place 2 sprays into both nostrils daily., Disp: 16 g, Rfl: 6 .  Norethin Ace-Eth Estrad-FE (TAYTULLA) 1-20 MG-MCG(24) CAPS, Take 20 mg by mouth daily. 2 SAMPLES GIVEN, Disp: 28 capsule, Rfl: 1 .  ofloxacin (OCUFLOX) 0.3 % ophthalmic solution, Place 1 drop into both eyes 4 (four) times daily for 7 days., Disp: 1.4 mL, Rfl: 0 .  ondansetron (ZOFRAN) 4 MG tablet, Take 1 tablet (4 mg total) by mouth every 8 (eight) hours as needed for nausea or vomiting., Disp: 20 tablet, Rfl: 0 .  phentermine (ADIPEX-P) 37.5 MG tablet, Take 1 tablet (37.5 mg total) by mouth daily before breakfast., Disp: 30 tablet, Rfl: 0  Review of Systems  Constitutional: Negative.   Eyes: Positive for blurred vision, pain, discharge, redness and itching.  Respiratory: Negative for cough.     Social History   Tobacco Use  . Smoking status: Former Games developer  . Smokeless tobacco: Never Used  . Tobacco comment: Per patient she has not smoke for the past six months  Substance Use Topics  . Alcohol use: Yes    Alcohol/week: 0.6 oz    Types: 1 Shots of liquor per week    Comment: occasional   Objective:   BP 116/70 (BP Location: Right Arm, Patient Position: Sitting, Cuff Size: Large)   Pulse 76    Temp 98.3 F (36.8 C)   Resp 16   Ht  (1.753 m)   Wt (!) 325 lb (147.4 kg)   SpO2 98%   BMI 47.99 kg/m  Vitals:   07/04/17 1208  BP: 116/70  Pulse: 76  Resp: 16  Temp: 98.3 F (36.8 C)  SpO2: 98%  Weight: (!) 325 lb (147.4 kg)  Height:  (1.753 m)     Physical Exam  Constitutional: She is oriented to person, place, and time. She appears well-developed and well-nourished.  Eyes: Pupils are equal, round, and reactive to light. EOM and lids are normal. Lids are everted and swept, no foreign bodies found. Right eye exhibits no discharge. Left eye exhibits discharge. Right conjunctiva is not injected. Left conjunctiva is injected.  Cardiovascular: Normal rate.  Neurological: She is alert and oriented to person, place, and time.  Skin: Skin is warm and dry.  Psychiatric: She has a normal mood and affect. Her behavior is normal.        Assessment & Plan:     1. Acute conjunctivitis of left eye, unspecified acute conjunctivitis type  - ofloxacin (OCUFLOX) 0.3 % ophthalmic solution; Place 1 drop into both eyes 4 (four) times daily for 7 days.  Dispense: 1.4 mL; Refill: 0  Return if symptoms worsen or fail  to improve.  The entirety of the information documented in the History of Present Illness, Review of Systems and Physical Exam were personally obtained by me. Portions of this information were initially documented by Anson [place], CMA and reviewed by me for thoroughness and accuracy.           Trey Sailors, PA-C  [facility]

## 2017-07-04 NOTE — Patient Instructions (Signed)

## 2017-08-08 ENCOUNTER — Ambulatory Visit (INDEPENDENT_AMBULATORY_CARE_PROVIDER_SITE_OTHER): Payer: BLUE CROSS/BLUE SHIELD | Admitting: Physician Assistant

## 2017-08-08 ENCOUNTER — Encounter: Payer: Self-pay | Admitting: Physician Assistant

## 2017-08-08 VITALS — BP 124/84 | HR 88 | Temp 98.4°F | Resp 16 | Wt 326.0 lb

## 2017-08-08 DIAGNOSIS — B9789 Other viral agents as the cause of diseases classified elsewhere: Secondary | ICD-10-CM

## 2017-08-08 DIAGNOSIS — J069 Acute upper respiratory infection, unspecified: Secondary | ICD-10-CM

## 2017-08-08 MED ORDER — PROMETHAZINE-DM 6.25-15 MG/5ML PO SYRP: 5.0000 mL | ORAL_SOLUTION | Freq: Four times a day (QID) | ORAL | 0 refills | Status: DC | PRN

## 2017-08-08 NOTE — Patient Instructions (Signed)

## 2017-08-08 NOTE — Progress Notes (Signed)
[facility]  Chief Complaint  Patient presents with  . URI    Started yesterday  . Fever    101.4 this morning.     Subjective:    Patient ID: Jill Moon, female    DOB: 08-05-1990, 27 y.o.   MRN: [id number]  Upper Respiratory Infection: Jill Moon is a 27 y.o. female complaining of symptoms of a URI, possible sinusitis. Symptoms include congestion, plugged sensation in both ears and sore throat. Onset of symptoms was 1 day ago, gradually worsening since that time. She also c/o congestion, cough described as nonproductive, nasal congestion, sinus pressure and sore throat for the past 1 day .  She is drinking plenty of fluids. Evaluation to date: none. Treatment to date: none. The treatment has provided moderate.   Review of Systems  Constitutional: Positive for chills, diaphoresis, fatigue and fever (Temp of 101.4 this morning. ). Negative for activity change, appetite change and unexpected weight change.  HENT: Positive for congestion, rhinorrhea, sinus pressure, sinus pain, sore throat and trouble swallowing. Negative for ear discharge, ear pain, postnasal drip, tinnitus and voice change.   Respiratory: Positive for cough and shortness of breath. Negative for apnea, choking, chest tightness, wheezing and stridor.   Gastrointestinal: Negative.   Musculoskeletal: Positive for myalgias and neck pain. Negative for arthralgias.  Neurological: Positive for headaches. Negative for dizziness and light-headedness.  Hematological: Negative for adenopathy.       Objective:   BP 124/84 (BP Location: Right Arm, Patient Position: Sitting, Cuff Size: Large)   Pulse 88   Temp 98.4 F (36.9 C) (Oral)   Resp 16   Wt (!) 326 lb (147.9 kg)   BMI 48.14 kg/m   Patient Active Problem List   Diagnosis Date Noted  . DUB (dysfunctional uterine bleeding) 01/30/2017  . S/P cholecystectomy 01/31/2016  . BMI 40.0-44.9, adult (HCC) 06/28/2015  .  Morbid obesity due to excess calories (HCC) 06/28/2015  . Abortion, spontaneous 05/05/2015  . Clinical depression 05/05/2015  . Irregular menstrual cycle 05/05/2015    Outpatient Encounter Medications as of 08/08/2017  Medication Sig  . Norethin Ace-Eth Estrad-FE (TAYTULLA) 1-20 MG-MCG(24) CAPS Take 20 mg by mouth daily. 2 SAMPLES GIVEN  . ondansetron (ZOFRAN) 4 MG tablet Take 1 tablet (4 mg total) by mouth every 8 (eight) hours as needed for nausea or vomiting.  . fluticasone (FLONASE) 50 MCG/ACT nasal spray Place 2 sprays into both nostrils daily. (Patient not taking: Reported on 08/08/2017)  . phentermine (ADIPEX-P) 37.5 MG tablet Take 1 tablet (37.5 mg total) by mouth daily before breakfast. (Patient not taking: Reported on 08/08/2017)   No facility-administered encounter medications on file as of 08/08/2017.     No Known Allergies     Physical Exam  Constitutional: She appears well-developed and well-nourished.  HENT:  Right Ear: External ear normal.  Left Ear: External ear normal.  Mouth/Throat: Oropharynx is clear and moist. No oropharyngeal exudate.  Eyes: Right eye exhibits discharge. Left eye exhibits discharge.  Neck: Neck supple.  Cardiovascular: Normal rate and regular rhythm.  Pulmonary/Chest: Effort normal and breath sounds normal. No respiratory distress. She has no rales.  Lymphadenopathy:    She has cervical adenopathy.  Skin: Skin is warm and dry.  Psychiatric: She has a normal mood and affect. Her behavior is normal.       Assessment & Plan:   1. Viral URI with cough  Counseled on course and duration. Work note provided.  -  promethazine-dextromethorphan (PROMETHAZINE-DM) 6.25-15 MG/5ML syrup; Take 5 mLs by mouth 4 (four) times daily as needed for cough.  Dispense: 118 mL; Refill: 0  Return if symptoms worsen or fail to improve.  The entirety of the information documented in the History of Present Illness, Review of Systems and Physical Exam were personally  obtained by me. Portions of this information were initially documented by Kavin LeechLaura Walsh, CMA and reviewed by me for thoroughness and accuracy.

## 2017-11-06 ENCOUNTER — Ambulatory Visit (INDEPENDENT_AMBULATORY_CARE_PROVIDER_SITE_OTHER): Payer: BLUE CROSS/BLUE SHIELD | Admitting: Obstetrics and Gynecology

## 2017-11-06 ENCOUNTER — Encounter: Payer: Self-pay | Admitting: Obstetrics and Gynecology

## 2017-11-06 VITALS — BP 120/78 | HR 75 | Ht 69.0 in | Wt 330.0 lb

## 2017-11-06 DIAGNOSIS — N938 Other specified abnormal uterine and vaginal bleeding: Secondary | ICD-10-CM

## 2017-11-06 DIAGNOSIS — E282 Polycystic ovarian syndrome: Secondary | ICD-10-CM

## 2017-11-06 MED ORDER — NORETHIN ACE-ETH ESTRAD-FE 1-20 MG-MCG(24) PO CAPS: 20.0000 mg | ORAL_CAPSULE | Freq: Every day | ORAL | 0 refills | Status: DC

## 2017-11-06 NOTE — Patient Instructions (Signed)
I value your feedback and entrusting us with your care. If you get a [facility] patient survey, I would appreciate you taking the time to let us know about your experience today. Thank you! 

## 2017-11-06 NOTE — Progress Notes (Signed)
[name], PA-C   Chief Complaint  Patient presents with  . Contraception    pt needs bc    HPI:      [name] is a 27 y.o. G0P0000 who LMP was Patient's last menstrual period was 09/23/2017 (exact date)., presents today for [facility] for cycle control. She was seen 11/18 and 1/19 for DUB. Pt with hx of PCOS, confirmed on u/s and sx. Pt did OCPs in the past but stopped fall 2017 due to mood changes. Had menses Q1-4 months off OCPs, but started DUB 11/18. Had neg thyroid labs, EM on u/s=8 mm. EMB attempted 1/19 but failed. Pt put on OCPs with cycle control. Pt stopped them a month later because she was considering conception. Was supposed to RTO for annual and f/u but never did.  Pt did not have period 1/19-7/22/19 and then started daily bleeding to present. Bleeding can be spotting to real flow. She is no longer sex active.  Pt tried metformin last fall but stopped it. She is not considering bariatric surg anymore.    Past Medical History:  Diagnosis Date  . Anxiety   . Boils   . BRCA negative 2017  . Depression   . Hirsutism   . Irregular menses   . Irregular periods/menstrual cycles 01/30/2017  . Obesity   . Pap smear of vagina with ASC-US   . Polycystic disease, ovaries     Past Surgical History:  Procedure Laterality Date  . CHOLECYSTECTOMY N/A 08/09/2015   Procedure: LAPAROSCOPIC CHOLECYSTECTOMY WITH INTRAOPERATIVE CHOLANGIOGRAM;  Surgeon: Dia Crawford III, MD;  Location: [facility] ORS;  Service: General;  Laterality: N/A;  . ERCP N/A 08/11/2015   Procedure: ENDOSCOPIC RETROGRADE CHOLANGIOPANCREATOGRAPHY (ERCP);  Surgeon: Lucilla Lame, MD;  Location: [facility] ENDOSCOPY;  Service: Endoscopy;  Laterality: N/A;  . ERCP N/A 11/29/2015   Procedure: ENDOSCOPIC RETROGRADE CHOLANGIOPANCREATOGRAPHY (ERCP) stent removal;  Surgeon: Lucilla Lame, MD;  Location: [facility] ENDOSCOPY;  Service: Endoscopy;  Laterality: N/A;  . TONSILLECTOMY AND ADENOIDECTOMY      Family History  Problem  Relation Age of Onset  . Diabetes Mother        Type 2  . Hypertension Mother   . Hyperlipidemia Mother   . Kidney Stones Father   . Cancer Paternal Aunt        Ovarian  . Cancer Paternal Grandfather        Liver  . Hypertension Paternal Grandfather     Social History   Socioeconomic History  . Marital status: Single    Spouse name: Not on file  . Number of children: Not on file  . Years of education: Not on file  . Highest education level: Not on file  Occupational History  . Not on file  Social Needs  . Financial resource strain: Not on file  . Food insecurity:    Worry: Not on file    Inability: Not on file  . Transportation needs:    Medical: Not on file    Non-medical: Not on file  Tobacco Use  . Smoking status: Former Research scientist (life sciences)  . Smokeless tobacco: Never Used  . Tobacco comment: Per patient she has not smoke for the past six months  Substance and Sexual Activity  . Alcohol use: Yes    Alcohol/week: 1.0 standard drinks    Types: 1 Shots of liquor per week    Comment: occasional  . Drug use: No  . Sexual activity: Not Currently    Birth control/protection:  None  Lifestyle  . Physical activity:    Days per week: Not on file    Minutes per session: Not on file  . Stress: Not on file  Relationships  . Social connections:    Talks on phone: Not on file    Gets together: Not on file    Attends religious service: Not on file    Active member of club or organization: Not on file    Attends meetings of clubs or organizations: Not on file    Relationship status: Not on file  . Intimate partner violence:    Fear of current or ex partner: Not on file    Emotionally abused: Not on file    Physically abused: Not on file    Forced sexual activity: Not on file  Other Topics Concern  . Not on file  Social History Narrative  . Not on file    Outpatient Medications Prior to Visit  Medication Sig Dispense Refill  . fluticasone (FLONASE) 50 MCG/ACT nasal spray Place  2 sprays into both nostrils daily. (Patient not taking: Reported on 08/08/2017) 16 g 6  . Norethin Ace-Eth Estrad-FE (TAYTULLA) 1-20 MG-MCG(24) CAPS Take 20 mg by mouth daily. 2 SAMPLES GIVEN (Patient not taking: Reported on 11/06/2017) 28 capsule 1  . ondansetron (ZOFRAN) 4 MG tablet Take 1 tablet (4 mg total) by mouth every 8 (eight) hours as needed for nausea or vomiting. 20 tablet 0  . phentermine (ADIPEX-P) 37.5 MG tablet Take 1 tablet (37.5 mg total) by mouth daily before breakfast. (Patient not taking: Reported on 08/08/2017) 30 tablet 0  . promethazine-dextromethorphan (PROMETHAZINE-DM) 6.25-15 MG/5ML syrup Take 5 mLs by mouth 4 (four) times daily as needed for cough. 118 mL 0   No facility-administered medications prior to visit.       ROS:  Review of Systems  Constitutional: Positive for fatigue. Negative for fever.  Gastrointestinal: Negative for blood in stool, constipation, diarrhea, nausea and vomiting.  Genitourinary: Positive for menstrual problem. Negative for dyspareunia, dysuria, flank pain, frequency, hematuria, urgency, vaginal bleeding, vaginal discharge and vaginal pain.  Musculoskeletal: Negative for back pain.  Skin: Negative for rash.  Neurological: Positive for headaches.  Psychiatric/Behavioral: Positive for agitation and dysphoric mood.    OBJECTIVE:   Vitals:  BP 120/78   Pulse 75   Ht '5\' 9"'  (1.753 m)   Wt (!) 330 lb (149.7 kg)   LMP 09/23/2017 (Exact Date)   BMI 48.73 kg/m   Physical Exam  Constitutional: She is oriented to person, place, and time. She appears well-developed.  Neck: Normal range of motion.  Pulmonary/Chest: Effort normal.  Musculoskeletal: Normal range of motion.  Neurological: She is alert and oriented to person, place, and time. No cranial nerve deficit.  Psychiatric: She has a normal mood and affect. Her behavior is normal. Judgment and thought content normal.  Vitals reviewed.   Assessment/Plan: DUB (dysfunctional uterine  bleeding) - After amenorrhea, due to PCOS. Failed EMB 1/19. Restart taytulla today. RTO in 2 wks for annual/f/u. Discussed PCOS and importance of withdrawal bleed - Plan: Norethin Ace-Eth Estrad-FE (TAYTULLA) 1-20 MG-MCG(24) CAPS  PCOS (polycystic ovarian syndrome) - Diet/exercise for wt loss. Encouraged Wt Watchers/MyFitnessPal app. Had neg DM labs 11/18. - Plan: Norethin Ace-Eth Estrad-FE (TAYTULLA) 1-20 MG-MCG(24) CAPS    Meds ordered this encounter  Medications  . Norethin Ace-Eth Estrad-FE (TAYTULLA) 1-20 MG-MCG(24) CAPS    Sig: Take 20 mg by mouth daily. 1 SAMPLE GIVEN    Dispense:  28  capsule    Refill:  0    Order Specific Question:   Supervising Provider    Answer:   Gae Dry [[id number]]      Return in about 2 weeks (around 4/45/8483) for annual.  Janal Haak B. Xochitl Egle, PA-C 11/06/2017 11:32 AM

## 2017-11-20 ENCOUNTER — Ambulatory Visit (INDEPENDENT_AMBULATORY_CARE_PROVIDER_SITE_OTHER): Payer: BLUE CROSS/BLUE SHIELD | Admitting: Obstetrics and Gynecology

## 2017-11-20 ENCOUNTER — Other Ambulatory Visit (HOSPITAL_COMMUNITY)
Admission: RE | Admit: 2017-11-20 | Discharge: 2017-11-20 | Disposition: A | Discharge status: Home / Self Care | Payer: BLUE CROSS/BLUE SHIELD | Source: Ambulatory Visit | Attending: Obstetrics and Gynecology | Admitting: Obstetrics and Gynecology

## 2017-11-20 ENCOUNTER — Encounter: Payer: Self-pay | Admitting: Obstetrics and Gynecology

## 2017-11-20 VITALS — BP 120/80 | HR 80 | Ht 69.0 in | Wt 330.0 lb

## 2017-11-20 DIAGNOSIS — Z01419 Encounter for gynecological examination (general) (routine) without abnormal findings: Secondary | ICD-10-CM

## 2017-11-20 DIAGNOSIS — Z124 Encounter for screening for malignant neoplasm of cervix: Secondary | ICD-10-CM

## 2017-11-20 DIAGNOSIS — E282 Polycystic ovarian syndrome: Secondary | ICD-10-CM

## 2017-11-20 DIAGNOSIS — Z113 Encounter for screening for infections with a predominantly sexual mode of transmission: Secondary | ICD-10-CM | POA: Diagnosis not present

## 2017-11-20 DIAGNOSIS — Z01411 Encounter for gynecological examination (general) (routine) with abnormal findings: Secondary | ICD-10-CM

## 2017-11-20 DIAGNOSIS — N938 Other specified abnormal uterine and vaginal bleeding: Secondary | ICD-10-CM

## 2017-11-20 DIAGNOSIS — Z3041 Encounter for surveillance of contraceptive pills: Secondary | ICD-10-CM | POA: Diagnosis not present

## 2017-11-20 MED ORDER — NORETHIN ACE-ETH ESTRAD-FE 1-20 MG-MCG(24) PO CAPS: 20.0000 mg | ORAL_CAPSULE | Freq: Every day | ORAL | 3 refills | Status: DC

## 2017-11-20 NOTE — Patient Instructions (Signed)
I value your feedback and entrusting us with your care. If you get a [facility] patient survey, I would appreciate you taking the time to let us know about your experience today. Thank you! 

## 2017-11-20 NOTE — Progress Notes (Signed)
PCP:  [name], PA-C   Chief Complaint  Patient presents with  . Gynecologic Exam    started spotting today and pt is concerned cause shes in the middle of pill pack.     HPI:      [name] is a 27 y.o. G0P0000 who LMP was Patient's last menstrual period was 11/20/2017 (exact date)., presents today for her annual examination.  Her menses are absent with PCOS but then has DUB. Had neg thyroid labs, EM=8 mm on u/s 1/19. EMB attempted 1/19 but pt couldn't tolerate. Pt started on OCPs and took for a couple months only. Stopped them because she was considering conception. Pt then had amenorrhea again until DUB started 09/23/17. Pt restarted on OCPs 2 wks ago. Doing well so far. Having BTB today. Dysmenorrhea none.   Sex activity: not sexually active.  Last Pap: August 25, 2015  Results were: no abnormalities Hx of STDs: none  There is no FH of breast cancer. There is a FH of ovarian cancer in her pat aunt. Pt is MyRisk neg 2017. The patient does not do self-breast exams.  Tobacco use: The patient denies current or previous tobacco use. Alcohol use: social drinker No drug use.  Exercise: not active  She does get adequate calcium but not Vitamin D in her diet.   Past Medical History:  Diagnosis Date  . Anxiety   . Boils   . BRCA negative 08/2015   MyRisk neg  . Depression   . Family history of ovarian cancer   . Hirsutism   . Irregular menses   . Irregular periods/menstrual cycles 01/30/2017  . Obesity   . Pap smear of vagina with ASC-US   . Polycystic disease, ovaries     Past Surgical History:  Procedure Laterality Date  . CHOLECYSTECTOMY N/A 08/09/2015   Procedure: LAPAROSCOPIC CHOLECYSTECTOMY WITH INTRAOPERATIVE CHOLANGIOGRAM;  Surgeon: Dia Crawford III, MD;  Location: [facility] ORS;  Service: General;  Laterality: N/A;  . ERCP N/A 08/11/2015   Procedure: ENDOSCOPIC RETROGRADE CHOLANGIOPANCREATOGRAPHY (ERCP);  Surgeon: Lucilla Lame, MD;  Location: [facility]  ENDOSCOPY;  Service: Endoscopy;  Laterality: N/A;  . ERCP N/A 11/29/2015   Procedure: ENDOSCOPIC RETROGRADE CHOLANGIOPANCREATOGRAPHY (ERCP) stent removal;  Surgeon: Lucilla Lame, MD;  Location: [facility] ENDOSCOPY;  Service: Endoscopy;  Laterality: N/A;  . TONSILLECTOMY AND ADENOIDECTOMY      Family History  Problem Relation Age of Onset  . Diabetes Mother        Type 2  . Hypertension Mother   . Hyperlipidemia Mother   . Kidney Stones Father   . Ovarian cancer Paternal Aunt   . Cancer Paternal Grandfather        Liver  . Hypertension Paternal Grandfather     Social History   Socioeconomic History  . Marital status: Single    Spouse name: Not on file  . Number of children: Not on file  . Years of education: Not on file  . Highest education level: Not on file  Occupational History  . Not on file  Social Needs  . Financial resource strain: Not on file  . Food insecurity:    Worry: Not on file    Inability: Not on file  . Transportation needs:    Medical: Not on file    Non-medical: Not on file  Tobacco Use  . Smoking status: Former Research scientist (life sciences)  . Smokeless tobacco: Never Used  . Tobacco comment: Per patient she has not smoke for the past  six months  Substance and Sexual Activity  . Alcohol use: Yes    Alcohol/week: 1.0 standard drinks    Types: 1 Shots of liquor per week    Comment: occasional  . Drug use: No  . Sexual activity: Not Currently    Birth control/protection: Pill  Lifestyle  . Physical activity:    Days per week: Not on file    Minutes per session: Not on file  . Stress: Not on file  Relationships  . Social connections:    Talks on phone: Not on file    Gets together: Not on file    Attends religious service: Not on file    Active member of club or organization: Not on file    Attends meetings of clubs or organizations: Not on file    Relationship status: Not on file  . Intimate partner violence:    Fear of current or ex partner: Not on file     Emotionally abused: Not on file    Physically abused: Not on file    Forced sexual activity: Not on file  Other Topics Concern  . Not on file  Social History Narrative  . Not on file    Outpatient Medications Prior to Visit  Medication Sig Dispense Refill  . Norethin Ace-Eth Estrad-FE (TAYTULLA) 1-20 MG-MCG(24) CAPS Take 20 mg by mouth daily. 1 SAMPLE GIVEN 28 capsule 0   No facility-administered medications prior to visit.      ROS:  Review of Systems  Constitutional: Positive for fatigue. Negative for fever and unexpected weight change.  Respiratory: Negative for cough, shortness of breath and wheezing.   Cardiovascular: Negative for chest pain, palpitations and leg swelling.  Gastrointestinal: Negative for blood in stool, constipation, diarrhea, nausea and vomiting.  Endocrine: Negative for cold intolerance, heat intolerance and polyuria.  Genitourinary: Positive for menstrual problem. Negative for dyspareunia, dysuria, flank pain, frequency, genital sores, hematuria, pelvic pain, urgency, vaginal bleeding, vaginal discharge and vaginal pain.  Musculoskeletal: Positive for arthralgias. Negative for back pain, joint swelling and myalgias.  Skin: Negative for rash.  Neurological: Positive for headaches. Negative for dizziness, syncope, light-headedness and numbness.  Hematological: Negative for adenopathy.  Psychiatric/Behavioral: Positive for agitation and dysphoric mood. Negative for confusion, sleep disturbance and suicidal ideas. The patient is not nervous/anxious.   BREAST: No symptoms   Objective: BP 120/80   Pulse 80   Ht _0  (1.753 m)   Wt (!) 330 lb (149.7 kg)   LMP 11/20/2017 (Exact Date)   BMI 48.73 kg/m    Physical Exam  Constitutional: She is oriented to person, place, and time. She appears well-developed and well-nourished.  Genitourinary: Uterus normal. There is no rash or tenderness on the right labia. There is no rash or tenderness on the left labia.  There is bleeding in the vagina. No erythema or tenderness in the vagina. No vaginal discharge found. Right adnexum does not display mass and does not display tenderness. Left adnexum does not display mass and does not display tenderness. Cervix does not exhibit motion tenderness or polyp. Uterus is not enlarged or tender.  Neck: Normal range of motion. No thyromegaly present.  Cardiovascular: Normal rate, regular rhythm and normal heart sounds.  No murmur heard. Pulmonary/Chest: Effort normal and breath sounds normal. Right breast exhibits no mass, no nipple discharge, no skin change and no tenderness. Left breast exhibits no mass, no nipple discharge, no skin change and no tenderness.  Abdominal: Soft. There is no tenderness. There is  no guarding.  Musculoskeletal: Normal range of motion.  Neurological: She is alert and oriented to person, place, and time. No cranial nerve deficit.  Psychiatric: She has a normal mood and affect. Her behavior is normal.  Vitals reviewed.   Assessment/Plan: Encounter for annual routine gynecological examination  Cervical cancer screening - Plan: Cytology - PAP  Screening for STD (sexually transmitted disease) - Plan: Cytology - PAP  Encounter for surveillance of contraceptive pills - OCP RF. F/u prn.  - Plan: Norethin Ace-Eth Estrad-FE (TAYTULLA) 1-20 MG-MCG(24) CAPS  PCOS (polycystic ovarian syndrome) - Controlling sx with OCPs. Diet/exercise/wt loss.  - Plan: Norethin Ace-Eth Estrad-FE (TAYTULLA) 1-20 MG-MCG(24) CAPS  DUB (dysfunctional uterine bleeding) - Due to PCOS. NEg eval, unable to do EMB. If sx persist with OCPs, will re-eval.  Meds ordered this encounter  Medications  . Norethin Ace-Eth Estrad-FE (TAYTULLA) 1-20 MG-MCG(24) CAPS    Sig: Take 20 mg by mouth daily.    Dispense:  84 capsule    Refill:  3    Order Specific Question:   Supervising Provider    Answer:   Gae Dry [[id number]]             GYN counsel adequate intake of  calcium and vitamin D, diet and exercise     F/U  Return in about 1 year (around 11/21/2018).  Liviana Mills B. Alanii Ramer, PA-C 11/20/2017 9:03 AM

## 2017-11-21 LAB — CYTOLOGY - PAP
CHLAMYDIA, DNA PROBE: NEGATIVE
DIAGNOSIS: NEGATIVE
NEISSERIA GONORRHEA: NEGATIVE
Trichomonas: NEGATIVE

## 2017-11-28 ENCOUNTER — Telehealth: Payer: Self-pay

## 2017-11-28 NOTE — Telephone Encounter (Signed)
Rebekah Chesterfield (pt mom) calling for patient. Patient had a message last week to call us. It may be in regards to her pap smear. Pt is hard to reach, she can't take calls at work. Mom's [phone]

## 2017-11-28 NOTE — Telephone Encounter (Signed)
I don't know who called her. Nothing to relay to her. Pap was normal. RN to notify pt's mom.

## 2017-11-29 NOTE — Telephone Encounter (Signed)
Pts mom aware. Also, mom asked about BC pills her daughter is suppose to start this weekend but we are still waiting on PA answer. Offered samples, pts mom will come pick them up this afternoon.

## 2017-12-02 ENCOUNTER — Other Ambulatory Visit: Payer: Self-pay | Admitting: Obstetrics and Gynecology

## 2017-12-02 MED ORDER — NORETHIN ACE-ETH ESTRAD-FE 1-20 MG-MCG(24) PO TABS: 1.0000 | ORAL_TABLET | Freq: Every day | ORAL | 3 refills | Status: DC

## 2017-12-02 NOTE — Telephone Encounter (Signed)
Pls notify pt that her taytulla OCPs were denied by insurance. I sent in different Rx that should be covered and is same pill, just tablet instead of capsule.

## 2017-12-02 NOTE — Progress Notes (Signed)
Rx change due to ins.

## 2017-12-02 NOTE — Telephone Encounter (Signed)
Called pt and no answer, so I called pt's mom since we talked last Friday, and she will let her daughter know of Rx change.

## 2018-03-31 NOTE — Progress Notes (Deleted)
       Patient: Jill Moon Female    DOB: December 18, 1990   27 y.o.   MRN: [id number] Visit Date: 03/31/2018  Today's Provider: Margaretann Loveless, PA-C   No chief complaint on file.  Subjective:     HPI  No Known Allergies   Current Outpatient Medications:  .  Norethindrone Acetate-Ethinyl Estrad-FE (MICROGESTIN 24 FE) 1-20 MG-MCG(24) tablet, Take 1 tablet by mouth daily., Disp: 84 tablet, Rfl: 3  Review of Systems  Constitutional: Negative for appetite change, chills, fatigue and fever.  Respiratory: Negative for chest tightness and shortness of breath.   Cardiovascular: Negative for chest pain and palpitations.  Gastrointestinal: Negative for abdominal pain, nausea and vomiting.  Neurological: Negative for dizziness and weakness.    Social History   Tobacco Use  . Smoking status: Former Games developer  . Smokeless tobacco: Never Used  . Tobacco comment: Per patient she has not smoke for the past six months  Substance Use Topics  . Alcohol use: Yes    Alcohol/week: 1.0 standard drinks    Types: 1 Shots of liquor per week    Comment: occasional      Objective:   There were no vitals taken for this visit. There were no vitals filed for this visit.   Physical Exam      Assessment & Plan        Margaretann Loveless, PA-C  [facility]

## 2018-04-02 ENCOUNTER — Ambulatory Visit: Payer: BLUE CROSS/BLUE SHIELD | Admitting: Physician Assistant

## 2018-04-08 ENCOUNTER — Encounter: Payer: Self-pay | Admitting: Emergency Medicine

## 2018-04-08 ENCOUNTER — Ambulatory Visit
Admission: EM | Admit: 2018-04-08 | Discharge: 2018-04-08 | Disposition: A | Discharge status: Home / Self Care | Payer: BLUE CROSS/BLUE SHIELD | Attending: Family Medicine | Admitting: Family Medicine

## 2018-04-08 ENCOUNTER — Other Ambulatory Visit: Payer: Self-pay

## 2018-04-08 DIAGNOSIS — S39012A Strain of muscle, fascia and tendon of lower back, initial encounter: Secondary | ICD-10-CM

## 2018-04-08 DIAGNOSIS — M545 Low back pain: Secondary | ICD-10-CM

## 2018-04-08 LAB — URINALYSIS, COMPLETE (UACMP) WITH MICROSCOPIC
Bacteria, UA: NONE SEEN
Bilirubin Urine: NEGATIVE
GLUCOSE, UA: NEGATIVE mg/dL
KETONES UR: NEGATIVE mg/dL
Leukocytes, UA: NEGATIVE
NITRITE: NEGATIVE
PROTEIN: NEGATIVE mg/dL
SQUAMOUS EPITHELIAL / LPF: NONE SEEN (ref 0–5)
Specific Gravity, Urine: 1.015 (ref 1.005–1.030)
WBC, UA: NONE SEEN WBC/hpf (ref 0–5)
pH: 6.5 (ref 5.0–8.0)

## 2018-04-08 MED ORDER — LIDOCAINE 5 % EX PTCH: 1.0000 | MEDICATED_PATCH | CUTANEOUS | 0 refills | Status: DC

## 2018-04-08 MED ORDER — MELOXICAM 15 MG PO TABS: 15.0000 mg | ORAL_TABLET | Freq: Every day | ORAL | 0 refills | Status: DC

## 2018-04-08 MED ORDER — METAXALONE 800 MG PO TABS: 800.0000 mg | ORAL_TABLET | Freq: Three times a day (TID) | ORAL | 0 refills | Status: DC

## 2018-04-08 NOTE — Discharge Instructions (Signed)
Apply ice 20 minutes out of every 2 hours 4-5 times daily for comfort. Use  Caution while taking muscle relaxers.  Do not perform activities requiring concentration or judgment and do not drive.  °

## 2018-04-08 NOTE — ED Triage Notes (Signed)
Patient c/o lower back pain that started yesterday.  Patient denies any injury or fall.

## 2018-04-08 NOTE — ED Provider Notes (Signed)
[facility]    CSN: [id number] Arrival date & time: 04/08/18  1119     History   Chief Complaint Chief Complaint  Patient presents with  . Back Pain    APPT    HPI Jill Moon is a 28 y.o. female.   HPI  28 year old female presents with lower back pain that started suddenly yesterday.  That she was standing from a seated position when she felt pain in her low back.  The pain is since been dating into her buttock and to her posterior thighs level of her knee.  No change in activities such as increasing walking or lifting.  The job that requires sitting for prolonged periods.  He has had no incontinence.        Past Medical History:  Diagnosis Date  . Anxiety   . Boils   . BRCA negative 08/2015   MyRisk neg  . Depression   . Family history of ovarian cancer   . Hirsutism   . Irregular menses   . Irregular periods/menstrual cycles 01/30/2017  . Obesity   . Pap smear of vagina with ASC-US   . Polycystic disease, ovaries     Patient Active Problem List   Diagnosis Date Noted  . DUB (dysfunctional uterine bleeding) 01/30/2017  . S/P cholecystectomy 01/31/2016  . BMI 40.0-44.9, adult ([place]) 06/28/2015  . Morbid obesity due to excess calories ([place]) 06/28/2015  . Abortion, spontaneous 05/05/2015  . Clinical depression 05/05/2015  . Irregular menstrual cycle 05/05/2015    Past Surgical History:  Procedure Laterality Date  . CHOLECYSTECTOMY N/A 08/09/2015   Procedure: LAPAROSCOPIC CHOLECYSTECTOMY WITH INTRAOPERATIVE CHOLANGIOGRAM;  Surgeon: Dia Crawford III, MD;  Location: [facility] ORS;  Service: General;  Laterality: N/A;  . ERCP N/A 08/11/2015   Procedure: ENDOSCOPIC RETROGRADE CHOLANGIOPANCREATOGRAPHY (ERCP);  Surgeon: Lucilla Lame, MD;  Location: [facility] ENDOSCOPY;  Service: Endoscopy;  Laterality: N/A;  . ERCP N/A 11/29/2015   Procedure: ENDOSCOPIC RETROGRADE CHOLANGIOPANCREATOGRAPHY (ERCP) stent removal;  Surgeon: Lucilla Lame, MD;  Location: [facility] ENDOSCOPY;   Service: Endoscopy;  Laterality: N/A;  . TONSILLECTOMY AND ADENOIDECTOMY      OB History    Gravida  0   Para  0   Term  0   Preterm  0   AB  0   Living  0     SAB  0   TAB  0   Ectopic  0   Multiple  0   Live Births  0            Home Medications    Prior to Admission medications   Medication Sig Start Date End Date Taking? Authorizing Provider  Norethindrone Acetate-Ethinyl Estrad-FE (MICROGESTIN 24 FE) 1-20 MG-MCG(24) tablet Take 1 tablet by mouth daily. 2/42/35  Yes Copland, Alicia B, PA-C  lidocaine (LIDODERM) 5 % Place 1 patch onto the skin daily. Remove & Discard patch within 12 hours or as directed by MD 04/08/18   Lorin Picket, PA-C  meloxicam (MOBIC) 15 MG tablet Take 1 tablet (15 mg total) by mouth daily. 04/08/18   Lorin Picket, PA-C  metaxalone (SKELAXIN) 800 MG tablet Take 1 tablet (800 mg total) by mouth 3 (three) times daily. 04/08/18   Lorin Picket, PA-C    Family History Family History  Problem Relation Age of Onset  . Diabetes Mother        Type 2  . Hypertension Mother   . Hyperlipidemia Mother   . Kidney Stones Father   .  Ovarian cancer Paternal Aunt   . Cancer Paternal Grandfather        Liver  . Hypertension Paternal Grandfather     Social History Social History   Tobacco Use  . Smoking status: Former Research scientist (life sciences)  . Smokeless tobacco: Never Used  . Tobacco comment: Per patient she has not smoke for the past six months  Substance Use Topics  . Alcohol use: Yes    Alcohol/week: 1.0 standard drinks    Types: 1 Shots of liquor per week    Comment: occasional  . Drug use: No     Allergies   Patient has no known allergies.   Review of Systems Review of Systems  Constitutional: Positive for activity change. Negative for appetite change, chills, fatigue and fever.  Musculoskeletal: Positive for back pain, gait problem and myalgias.  All other systems reviewed and are negative.    Physical Exam Triage Vital  Signs ED Triage Vitals  Enc Vitals Group     BP 04/08/18 1156 139/90     Pulse Rate 04/08/18 1156 88     Resp 04/08/18 1156 16     Temp 04/08/18 1156 98.2 F (36.8 C)     Temp Source 04/08/18 1156 Oral     SpO2 04/08/18 1156 99 %     Weight 04/08/18 1154 (!) 330 lb (149.7 kg)     Height 04/08/18 1154 '5\' 9"'  (1.753 m)     Head Circumference --      Peak Flow --      Pain Score 04/08/18 1154 6     Pain Loc --      Pain Edu? --      Excl. in [place]? --    No data found.  Updated Vital Signs BP 139/90 (BP Location: Left Arm)   Pulse 88   Temp 98.2 F (36.8 C) (Oral)   Resp 16   Ht '5\' 9"'  (1.753 m)   Wt (!) 330 lb (149.7 kg)   LMP 04/05/2018 (Exact Date)   SpO2 99%   BMI 48.73 kg/m   Visual Acuity Right Eye Distance:   Left Eye Distance:   Bilateral Distance:    Right Eye Near:   Left Eye Near:    Bilateral Near:     Physical Exam Vitals signs and nursing note reviewed.  Constitutional:      General: She is not in acute distress.    Appearance: Normal appearance. She is obese. She is not ill-appearing, toxic-appearing or diaphoretic.  HENT:     Head: Normocephalic.     Nose: Nose normal.     Mouth/Throat:     Mouth: Mucous membranes are moist.  Eyes:     Conjunctiva/sclera: Conjunctivae normal.  Neck:     Musculoskeletal: Normal range of motion and neck supple.  Pulmonary:     Effort: Pulmonary effort is normal.     Breath sounds: Normal breath sounds.  Musculoskeletal: Normal range of motion.        General: Tenderness present.     Comments: Patient has a rightward list in stance.  Flattening of the lumbar lordotic curve.  Flexion to the level of the knees with her hands.  Returning to upright posture is more difficult.  Flexion bilaterally is uncomfortable.restricted.  This is reproducible and lysed over the right sacroiliac joint.  Will toe and heel walk normally.  EHL peroneal and anterior tibialis are strong to clinical testing.  DTR's are 2+/4 in the knee and  ankle on the  right.  They are not elicited on the left.  Straight leg raise testing in the seated position was negative at 90 degrees on the left and positive at 90 degrees on the right with low back pain and radiation in the posterior thigh.  Skin:    General: Skin is warm and dry.  Neurological:     General: No focal deficit present.     Mental Status: She is alert and oriented to person, place, and time.  Psychiatric:        Mood and Affect: Mood normal.        Behavior: Behavior normal.        Thought Content: Thought content normal.        Judgment: Judgment normal.      UC Treatments / Results  Labs (all labs ordered are listed, but only abnormal results are displayed) Labs Reviewed  URINALYSIS, COMPLETE (UACMP) WITH MICROSCOPIC - Abnormal; Notable for the following components:      Result Value   Hgb urine dipstick MODERATE (*)    All other components within normal limits    EKG None  Radiology No results found.  Procedures Procedures (including critical care time)  Medications Ordered in UC Medications - No data to display  Initial Impression / Assessment and Plan / UC Course  I have reviewed the triage vital signs and the nursing notes.  Pertinent labs & imaging results that were available during my care of the patient were reviewed by me and considered in my medical decision making (see chart for details).   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  Treated conservatively with Mobic and Skelaxin.  Avoid symptoms much as possible.  Also provide her with lidocaine patches.  If not improving she should follow-up with her primary care physician or return to our clinic.     Final Clinical Impressions(s) / UC Diagnoses   Final diagnoses:  Strain of lumbar region, initial encounter     Discharge Instructions     Apply ice 20 minutes out of every 2 hours 4-5 times daily for comfort. Use  Caution while taking muscle relaxers.  Do not perform activities  requiring concentration or judgment and do not drive.    ED Prescriptions    Medication Sig Dispense Auth. Provider   meloxicam (MOBIC) 15 MG tablet Take 1 tablet (15 mg total) by mouth daily. 30 tablet Lorin Picket, PA-C   metaxalone (SKELAXIN) 800 MG tablet Take 1 tablet (800 mg total) by mouth 3 (three) times daily. 21 tablet Crecencio Mc P, PA-C   lidocaine (LIDODERM) 5 % Place 1 patch onto the skin daily. Remove & Discard patch within 12 hours or as directed by MD 6 patch Lorin Picket, PA-C     Controlled Substance Prescriptions [place] Controlled Substance Registry consulted? Not Applicable   Lorin Picket, PA-C 04/08/18 1329

## 2018-04-11 ENCOUNTER — Encounter: Payer: Self-pay | Admitting: Physician Assistant

## 2018-04-11 ENCOUNTER — Ambulatory Visit: Payer: BLUE CROSS/BLUE SHIELD | Admitting: Physician Assistant

## 2018-04-11 VITALS — BP 132/87 | HR 109 | Temp 97.9°F | Resp 16 | Wt 334.0 lb

## 2018-04-11 DIAGNOSIS — Z20828 Contact with and (suspected) exposure to other viral communicable diseases: Secondary | ICD-10-CM | POA: Diagnosis not present

## 2018-04-11 DIAGNOSIS — J069 Acute upper respiratory infection, unspecified: Secondary | ICD-10-CM

## 2018-04-11 DIAGNOSIS — Z6841 Body Mass Index (BMI) 40.0 and over, adult: Secondary | ICD-10-CM

## 2018-04-11 DIAGNOSIS — B9789 Other viral agents as the cause of diseases classified elsewhere: Secondary | ICD-10-CM | POA: Diagnosis not present

## 2018-04-11 LAB — POCT INFLUENZA A/B
INFLUENZA A, POC: NEGATIVE
Influenza B, POC: NEGATIVE

## 2018-04-11 MED ORDER — PSEUDOEPH-BROMPHEN-DM 30-2-10 MG/5ML PO SYRP: 5.0000 mL | ORAL_SOLUTION | Freq: Four times a day (QID) | ORAL | 0 refills | Status: DC | PRN

## 2018-04-11 NOTE — Patient Instructions (Signed)
Viral Respiratory Infection  A viral respiratory infection is an illness that affects parts of the body that are used for breathing. These include the lungs, nose, and throat. It is caused by a germ called a virus.  Some examples of this kind of infection are:  · A cold.  · The flu (influenza).  · A respiratory syncytial virus (RSV) infection.  A person who gets this illness may have the following symptoms:  · A stuffy or runny nose.  · Yellow or green fluid in the nose.  · A cough.  · Sneezing.  · Tiredness (fatigue).  · Achy muscles.  · A sore throat.  · Sweating or chills.  · A fever.  · A headache.  Follow these instructions at home:  Managing pain and congestion  · Take over-the-counter and prescription medicines only as told by your doctor.  · If you have a sore throat, gargle with salt water. Do this 3-4 times per day or as needed. To make a salt-water mixture, dissolve ½-1 tsp of salt in 1 cup of warm water. Make sure that all the salt dissolves.  · Use nose drops made from salt water. This helps with stuffiness (congestion). It also helps soften the skin around your nose.  · Drink enough fluid to keep your pee (urine) pale yellow.  General instructions    · Rest as much as possible.  · Do not drink alcohol.  · Do not use any products that have nicotine or tobacco, such as cigarettes and e-cigarettes. If you need help quitting, ask your doctor.  · Keep all follow-up visits as told by your doctor. This is important.  How is this prevented?    · Get a flu shot every year. Ask your doctor when you should get your flu shot.  · Do not let other people get your germs. If you are sick:  ? Stay home from work or school.  ? Wash your hands with soap and water often. Wash your hands after you cough or sneeze. If soap and water are not available, use hand sanitizer.  · Avoid contact with people who are sick during cold and flu season. This is in fall and winter.  Get help if:  · Your symptoms last for 10 days or  longer.  · Your symptoms get worse over time.  · You have a fever.  · You have very bad pain in your face or forehead.  · Parts of your jaw or neck become very swollen.  Get help right away if:  · You feel pain or pressure in your chest.  · You have shortness of breath.  · You faint or feel like you will faint.  · You keep throwing up (vomiting).  · You feel confused.  Summary  · A viral respiratory infection is an illness that affects parts of the body that are used for breathing.  · Examples of this illness include a cold, the flu, and respiratory syncytial virus (RSV) infection.  · The infection can cause a runny nose, cough, sneezing, sore throat, and fever.  · Follow what your doctor tells you about taking medicines, drinking lots of fluid, washing your hands, resting at home, and avoiding people who are sick.  This information is not intended to replace advice given to you by your health care provider. Make sure you discuss any questions you have with your health care provider.  Document Released: 02/02/2008 Document Revised: 04/01/2017 Document Reviewed: 04/01/2017  Elsevier   Interactive Patient Education © 2019 Elsevier Inc.

## 2018-04-11 NOTE — Progress Notes (Signed)
Patient: Jill Moon Female    DOB: Feb 26, 1991   27 y.o.   MRN: [id number] Visit Date: 04/11/2018  Today's Provider: Margaretann Loveless, PA-C   Chief Complaint  Patient presents with  . Sore Throat   Subjective:     Sore Throat   This is a new problem. The current episode started yesterday (night). The problem has been gradually worsening. Neither side of throat is experiencing more pain than the other. There has been no fever. Associated symptoms include congestion, coughing ("dry"), diarrhea, ear pain, headaches, a plugged ear sensation, shortness of breath and swollen glands. Pertinent negatives include no hoarse voice, trouble swallowing or vomiting. She has had no exposure to strep or mono. She has tried nothing for the symptoms.   Patient was seen at the urgent care on Tuesday for back strain.   No Known Allergies   Current Outpatient Medications:  .  lidocaine (LIDODERM) 5 %, Place 1 patch onto the skin daily. Remove & Discard patch within 12 hours or as directed by MD, Disp: 6 patch, Rfl: 0 .  meloxicam (MOBIC) 15 MG tablet, Take 1 tablet (15 mg total) by mouth daily., Disp: 30 tablet, Rfl: 0 .  metaxalone (SKELAXIN) 800 MG tablet, Take 1 tablet (800 mg total) by mouth 3 (three) times daily., Disp: 21 tablet, Rfl: 0 .  Norethindrone Acetate-Ethinyl Estrad-FE (MICROGESTIN 24 FE) 1-20 MG-MCG(24) tablet, Take 1 tablet by mouth daily., Disp: 84 tablet, Rfl: 3  Review of Systems  Constitutional: Negative for chills and fever.  HENT: Positive for congestion, ear pain and sore throat. Negative for hoarse voice, postnasal drip, sinus pressure, sinus pain, sneezing and trouble swallowing.   Respiratory: Positive for cough ("dry"), shortness of breath and wheezing. Negative for chest tightness.   Cardiovascular: Negative for chest pain, palpitations and leg swelling.  Gastrointestinal: Positive for diarrhea. Negative for vomiting.  Neurological: Positive for headaches.     Social History   Tobacco Use  . Smoking status: Former Games developer  . Smokeless tobacco: Never Used  . Tobacco comment: Per patient she has not smoke for the past six months  Substance Use Topics  . Alcohol use: Yes    Alcohol/week: 1.0 standard drinks    Types: 1 Shots of liquor per week    Comment: occasional      Objective:   BP 132/87 (BP Location: Left Arm, Patient Position: Sitting, Cuff Size: Large)   Pulse (!) 109   Temp 97.9 F (36.6 C) (Oral)   Resp 16   Wt (!) 334 lb (151.5 kg)   LMP 04/05/2018 (Exact Date)   BMI 49.32 kg/m  Vitals:   04/11/18 1058  BP: 132/87  Pulse: (!) 109  Resp: 16  Temp: 97.9 F (36.6 C)  TempSrc: Oral  Weight: (!) 334 lb (151.5 kg)     Physical Exam Vitals signs reviewed.  Constitutional:      General: She is not in acute distress.    Appearance: She is well-developed. She is not diaphoretic.  HENT:     Head: Normocephalic and atraumatic.     Right Ear: Hearing, tympanic membrane, ear canal and external ear normal. No middle ear effusion.     Left Ear: Hearing, tympanic membrane, ear canal and external ear normal.  No middle ear effusion.     Nose: Congestion and rhinorrhea present.     Mouth/Throat:     Mouth: Mucous membranes are moist.     Pharynx: Uvula  midline. No oropharyngeal exudate or posterior oropharyngeal erythema.  Eyes:     General: No scleral icterus.       Right eye: No discharge.        Left eye: No discharge.     Conjunctiva/sclera: Conjunctivae normal.     Pupils: Pupils are equal, round, and reactive to light.  Neck:     Musculoskeletal: Normal range of motion and neck supple.     Thyroid: No thyromegaly.     Trachea: No tracheal deviation.  Cardiovascular:     Rate and Rhythm: Normal rate and regular rhythm.     Heart sounds: Normal heart sounds. No murmur. No friction rub. No gallop.   Pulmonary:     Effort: Pulmonary effort is normal. No respiratory distress.     Breath sounds: Normal breath  sounds. No stridor. No wheezing or rales.  Lymphadenopathy:     Cervical: No cervical adenopathy.  Skin:    General: Skin is warm and dry.        Assessment & Plan    1. Viral URI with cough Advised to push fluids. May use symptomatic relief medication OTC of choice. Bromfed DM sent in as below for cough and congestion. Call if worsening or no improvement over 7-10 days.  - brompheniramine-pseudoephedrine-DM 30-2-10 MG/5ML syrup; Take 5 mLs by mouth 4 (four) times daily as needed.  Dispense: 180 mL; Refill: 0  2. Exposure to the flu Flu negative, coworkers have been diagnosed.  - POCT Influenza A/B  3. Class 3 severe obesity due to excess calories without serious comorbidity with body mass index (BMI) of 45.0 to 49.9 in adult [facility](HCC) Counseled patient on healthy lifestyle modifications including dieting and exercise.      Margaretann LovelessJennifer M Djimon Lundstrom, PA-C  [facility]

## 2018-04-23 ENCOUNTER — Other Ambulatory Visit: Payer: Self-pay

## 2018-04-23 MED ORDER — NORETHIN ACE-ETH ESTRAD-FE 1-20 MG-MCG(24) PO TABS: 1.0000 | ORAL_TABLET | Freq: Every day | ORAL | 3 refills | Status: DC

## 2018-04-23 NOTE — Telephone Encounter (Signed)
Pt called sattes she never picked up RX and the pharm doesn't have it anymore. So I sent the medication back in to pharm. pt picking up today

## 2018-04-29 ENCOUNTER — Ambulatory Visit
Admission: EM | Admit: 2018-04-29 | Discharge: 2018-04-29 | Disposition: A | Discharge status: Home / Self Care | Payer: BLUE CROSS/BLUE SHIELD | Attending: Family Medicine | Admitting: Family Medicine

## 2018-04-29 ENCOUNTER — Ambulatory Visit (INDEPENDENT_AMBULATORY_CARE_PROVIDER_SITE_OTHER): Payer: BLUE CROSS/BLUE SHIELD

## 2018-04-29 ENCOUNTER — Other Ambulatory Visit: Payer: Self-pay

## 2018-04-29 ENCOUNTER — Encounter: Payer: Self-pay | Admitting: Emergency Medicine

## 2018-04-29 DIAGNOSIS — S2020XA Contusion of thorax, unspecified, initial encounter: Secondary | ICD-10-CM | POA: Diagnosis not present

## 2018-04-29 DIAGNOSIS — S80212A Abrasion, left knee, initial encounter: Secondary | ICD-10-CM

## 2018-04-29 DIAGNOSIS — M79645 Pain in left finger(s): Secondary | ICD-10-CM | POA: Diagnosis not present

## 2018-04-29 DIAGNOSIS — M1250 Traumatic arthropathy, unspecified site: Secondary | ICD-10-CM | POA: Diagnosis not present

## 2018-04-29 DIAGNOSIS — S6992XA Unspecified injury of left wrist, hand and finger(s), initial encounter: Secondary | ICD-10-CM | POA: Diagnosis not present

## 2018-04-29 MED ORDER — NAPROXEN 500 MG PO TABS: 500.0000 mg | ORAL_TABLET | Freq: Two times a day (BID) | ORAL | 0 refills | Status: DC

## 2018-04-29 MED ORDER — MUPIROCIN 2 % EX OINT: 1.0000 "application " | TOPICAL_OINTMENT | Freq: Three times a day (TID) | CUTANEOUS | 0 refills | Status: DC
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
## 2018-08-20 NOTE — Progress Notes (Signed)
Patient: Jill Moon Female    DOB: Apr 01, 1990   28 y.o.   MRN: [id number] Visit Date: 08/21/2018  Today's Provider: [name], PA-C   Chief Complaint  Patient presents with  . Migraine   Subjective:     Migraine  This is a recurrent (off and on for the past three- four months) problem. Episode onset: one week ago and now is better. The pain is located in the frontal (and the back of her head) region. The pain radiates to the face. The quality of the pain is described as aching and boring. The pain is moderate. Associated symptoms include nausea, photophobia and vomiting. Pertinent negatives include no abdominal pain, blurred vision, dizziness, eye pain, fever or weakness. The symptoms are aggravated by bright light. She has tried NSAIDs and darkened room (bedrest,vick vapor rub) for the symptoms.     No Known Allergies   Current Outpatient Medications:  .  Norethindrone Acetate-Ethinyl Estrad-FE (MICROGESTIN 24 FE) 1-20 MG-MCG(24) tablet, Take 1 tablet by mouth daily., Disp: 84 tablet, Rfl: 3  Review of Systems  Constitutional: Negative for appetite change, chills, fatigue and fever.  Eyes: Positive for photophobia. Negative for blurred vision, pain and discharge.  Respiratory: Negative for chest tightness and shortness of breath.   Cardiovascular: Negative for chest pain and palpitations.  Gastrointestinal: Positive for nausea and vomiting. Negative for abdominal pain.  Neurological: Positive for headaches. Negative for dizziness and weakness.    Social History   Tobacco Use  . Smoking status: Former Research scientist (life sciences)  . Smokeless tobacco: Never Used  . Tobacco comment: Per patient she has not smoke for the past six months  Substance Use Topics  . Alcohol use: Yes    Alcohol/week: 1.0 standard drinks    Types: 1 Shots of liquor per week    Comment: occasional      Objective:   BP 132/82 (BP Location: Left Arm, Patient Position: Sitting, Cuff Size: Large)    Pulse 99   Temp 98.5 F (36.9 C) (Oral)   Resp 16   Wt (!) 328 lb (148.8 kg)   LMP 08/09/2018   BMI 47.06 kg/m  Vitals:   08/21/18 0812  BP: 132/82  Pulse: 99  Resp: 16  Temp: 98.5 F (36.9 C)  TempSrc: Oral  Weight: (!) 328 lb (148.8 kg)     Physical Exam Vitals signs reviewed.  Constitutional:      General: She is not in acute distress.    Appearance: Normal appearance. She is well-developed. She is obese. She is not ill-appearing or diaphoretic.  HENT:     Head: Normocephalic and atraumatic.     Right Ear: Tympanic membrane, ear canal and external ear normal.     Left Ear: Tympanic membrane, ear canal and external ear normal.     Nose: Nose normal.     Mouth/Throat:     Mouth: Mucous membranes are moist.  Eyes:     General: No scleral icterus.    Extraocular Movements: Extraocular movements intact.     Conjunctiva/sclera: Conjunctivae normal.     Pupils: Pupils are equal, round, and reactive to light.  Neck:     Musculoskeletal: Normal range of motion and neck supple.     Thyroid: No thyromegaly.     Vascular: No JVD.     Trachea: No tracheal deviation.  Cardiovascular:     Rate and Rhythm: Normal rate and regular rhythm.     Pulses: Normal pulses.  Heart sounds: Normal heart sounds. No murmur. No friction rub. No gallop.   Pulmonary:     Effort: Pulmonary effort is normal. No respiratory distress.     Breath sounds: Normal breath sounds. No wheezing or rales.  Lymphadenopathy:     Cervical: No cervical adenopathy.  Skin:    Capillary Refill: Capillary refill takes less than 2 seconds.  Neurological:     General: No focal deficit present.     Mental Status: She is alert and oriented to person, place, and time. Mental status is at baseline.     Cranial Nerves: No cranial nerve deficit.     Motor: No weakness.     Gait: Gait normal.  Psychiatric:        Mood and Affect: Mood normal.        Behavior: Behavior normal.        Thought Content: Thought  content normal.        Judgment: Judgment normal.         Assessment & Plan    1. Migraine without aura and without status migrainosus, not intractable Has had migraines in the past but were less than 2-3 per year. Now she is having them 2-3 per month, they are not responding to normal OTC remedies (excedrin migraine or IBU) and now lasting up to a week at a time. Due to change in frequency we will get CT head as below to r/o mass or other cause of change. Will also start abortive treatment with Imitrex as below. Zofran for associated nausea. I will f/u in 4-6 weeks to see how the imitrex has worked and if she is tolerating it. Will f/u pending CT results as well.  - SUMAtriptan (IMITREX) 50 MG tablet; Take 1 tablet (50 mg total) by mouth every 2 (two) hours as needed for migraine. No more than 200mg  in 24 hr period  Dispense: 10 tablet; Refill: 0 - ondansetron (ZOFRAN) 4 MG tablet; Take 1 tablet (4 mg total) by mouth every 8 (eight) hours as needed.  Dispense: 20 tablet; Refill: 0 - CT HEAD W & WO CONTRAST; Future  2. Other headache syndrome See above medical treatment plan. - CT HEAD W & WO CONTRAST; Future     Margaretann LovelessJennifer M Burnette, PA-C  [facility]

## 2018-08-21 ENCOUNTER — Ambulatory Visit (INDEPENDENT_AMBULATORY_CARE_PROVIDER_SITE_OTHER): Payer: BC Managed Care – PPO | Admitting: Physician Assistant

## 2018-08-21 ENCOUNTER — Encounter: Payer: Self-pay | Admitting: Physician Assistant

## 2018-08-21 ENCOUNTER — Other Ambulatory Visit: Payer: Self-pay

## 2018-08-21 VITALS — BP 132/82 | HR 99 | Temp 98.5°F | Resp 16 | Wt 328.0 lb

## 2018-08-21 DIAGNOSIS — G43009 Migraine without aura, not intractable, without status migrainosus: Secondary | ICD-10-CM

## 2018-08-21 DIAGNOSIS — G4489 Other headache syndrome: Secondary | ICD-10-CM

## 2018-08-21 MED ORDER — ONDANSETRON HCL 4 MG PO TABS: 4.0000 mg | ORAL_TABLET | Freq: Three times a day (TID) | ORAL | 0 refills | Status: DC | PRN

## 2018-08-21 MED ORDER — SUMATRIPTAN SUCCINATE 50 MG PO TABS: 50.0000 mg | ORAL_TABLET | ORAL | 0 refills | Status: DC | PRN

## 2018-08-21 NOTE — Patient Instructions (Signed)
Migraine Headache  A migraine headache is an intense, throbbing pain on one side or both sides of the head. Migraines may also cause other symptoms, such as nausea, vomiting, and sensitivity to light and noise.  What are the causes?  Doing or taking certain things may also trigger migraines, such as:  Alcohol.  Smoking.  Medicines, such as:  Medicine used to treat chest pain (nitroglycerine).  Birth control pills.  Estrogen pills.  Certain blood pressure medicines.  Aged cheeses, chocolate, or caffeine.  Foods or drinks that contain nitrates, glutamate, aspartame, or tyramine.  Physical activity.  Other things that may trigger a migraine include:  Menstruation.  Pregnancy.  Hunger.  Stress, lack of sleep, too much sleep, or fatigue.  Weather changes.  What increases the risk?  The following factors may make you more likely to experience migraine headaches:  Age. Risk increases with age.  Family history of migraine headaches.  Being Caucasian.  Depression and anxiety.  Obesity.  Being a woman.  Having a hole in the heart (patent foramen ovale) or other heart problems.  What are the signs or symptoms?  The main symptom of this condition is pulsating or throbbing pain. Pain may:  Happen in any area of the head, such as on one side or both sides.  Interfere with daily activities.  Get worse with physical activity.  Get worse with exposure to bright lights or loud noises.  Other symptoms may include:  Nausea.  Vomiting.  Dizziness.  General sensitivity to bright lights, loud noises, or smells.  Before you get a migraine, you may get warning signs that a migraine is developing (aura). An aura may include:  Seeing flashing lights or having blind spots.  Seeing bright spots, halos, or zigzag lines.  Having tunnel vision or blurred vision.  Having numbness or a tingling feeling.  Having trouble talking.  Having muscle weakness.  How is this diagnosed?  A migraine headache can be diagnosed based on:  Your symptoms.  A  physical exam.  Tests, such as CT scan or MRI of the head. These imaging tests can help rule out other causes of headaches.  Taking fluid from the spine (lumbar puncture) and analyzing it (cerebrospinal fluid analysis, or CSF analysis).  How is this treated?  A migraine headache is usually treated with medicines that:  Relieve pain.  Relieve nausea.  Prevent migraines from coming back.  Treatment may also include:  Acupuncture.  Lifestyle changes like avoiding foods that trigger migraines.  Follow these instructions at home:  Medicines  Take over-the-counter and prescription medicines only as told by your health care provider.  Do not drive or use heavy machinery while taking prescription pain medicine.  To prevent or treat constipation while you are taking prescription pain medicine, your health care provider may recommend that you:  Drink enough fluid to keep your urine clear or pale yellow.  Take over-the-counter or prescription medicines.  Eat foods that are high in fiber, such as fresh fruits and vegetables, whole grains, and beans.  Limit foods that are high in fat and processed sugars, such as fried and sweet foods.  Lifestyle  Avoid alcohol use.  Do not use any products that contain nicotine or tobacco, such as cigarettes and e-cigarettes. If you need help quitting, ask your health care provider.  Get at least 8 hours of sleep every night.  Limit your stress.  General instructions         Keep a journal to find   out what may trigger your migraine headaches. For example, write down:  What you eat and drink.  How much sleep you get.  Any change to your diet or medicines.  If you have a migraine:  Avoid things that make your symptoms worse, such as bright lights.  It may help to lie down in a dark, quiet room.  Do not drive or use heavy machinery.  Ask your health care provider what activities are safe for you while you are experiencing symptoms.  Keep all follow-up visits as told by your health care provider.  This is important.  Contact a health care provider if:  You develop symptoms that are different or more severe than your usual migraine symptoms.  Get help right away if:  Your migraine becomes severe.  You have a fever.  You have a stiff neck.  You have vision loss.  Your muscles feel weak or like you cannot control them.  You start to lose your balance often.  You develop trouble walking.  You faint.  This information is not intended to replace advice given to you by your health care provider. Make sure you discuss any questions you have with your health care provider.  Document Released: 02/19/2005 Document Revised: 09/09/2015 Document Reviewed: 08/08/2015  Elsevier Interactive Patient Education  2019 Elsevier Inc.    Sumatriptan tablets  What is this medicine?  SUMATRIPTAN (soo ma TRIP tan) is used to treat migraines with or without aura. An aura is a strange feeling or visual disturbance that warns you of an attack. It is not used to prevent migraines.  This medicine may be used for other purposes; ask your health care provider or pharmacist if you have questions.  COMMON BRAND NAME(S): Imitrex, Migraine Pack  What should I tell my health care provider before I take this medicine?  They need to know if you have any of these conditions:  -cigarette smoker  -circulation problems in fingers and toes  -diabetes  -heart disease  -high blood pressure  -high cholesterol  -history of irregular heartbeat  -history of stroke  -kidney disease  -liver disease  -stomach or intestine problems  -an unusual or allergic reaction to sumatriptan, other medicines, foods, dyes, or preservatives  -pregnant or trying to get pregnant  -breast-feeding  How should I use this medicine?  Take this medicine by mouth with a glass of water. Follow the directions on the prescription label. Do not take it more often than directed.  Talk to your pediatrician regarding the use of this medicine in children. Special care may be  needed.  Overdosage: If you think you have taken too much of this medicine contact a poison control center or emergency room at once.  NOTE: This medicine is only for you. Do not share this medicine with others.  What if I miss a dose?  This does not apply. This medicine is not for regular use.  What may interact with this medicine?  Do not take this medicine with any of the following medicines:  -certain medicines for migraine headache like almotriptan, eletriptan, frovatriptan, naratriptan, rizatriptan, sumatriptan, zolmitriptan  -ergot alkaloids like dihydroergotamine, ergonovine, ergotamine, methylergonovine  -MAOIs like Carbex, Eldepryl, Marplan, Nardil, and Parnate  This medicine may also interact with the following medications:  -certain medicines for depression, anxiety, or psychotic disorders  This list may not describe all possible interactions. Give your health care provider a list of all the medicines, herbs, non-prescription drugs, or dietary supplements you use. Also tell   them if you smoke, drink alcohol, or use illegal drugs. Some items may interact with your medicine.  What should I watch for while using this medicine?  Visit your healthcare professional for regular checks on your progress. Tell your healthcare professional if your symptoms do not start to get better or if they get worse.  You may get drowsy or dizzy. Do not drive, use machinery, or do anything that needs mental alertness until you know how this medicine affects you. Do not stand up or sit up quickly, especially if you are an older patient. This reduces the risk of dizzy or fainting spells. Alcohol may interfere with the effect of this medicine.  Tell your healthcare professional right away if you have any change in your eyesight.  If you take migraine medicines for 10 or more days a month, your migraines may get worse. Keep a diary of headache days and medicine use. Contact your healthcare professional if your migraine attacks occur  more frequently.  What side effects may I notice from receiving this medicine?  Side effects that you should report to your doctor or health care professional as soon as possible:  -allergic reactions like skin rash, itching or hives, swelling of the face, lips, or tongue  -changes in vision  -chest pain or chest tightness  -signs and symptoms of a dangerous change in heartbeat or heart rhythm like chest pain; dizziness; fast, irregular heartbeat; palpitations; feeling faint or lightheaded; falls; breathing problems  -signs and symptoms of a stroke like changes in vision; confusion; trouble speaking or understanding; severe headaches; sudden numbness or weakness of the face, arm or leg; trouble walking; dizziness; loss of balance or coordination  -signs and symptoms of serotonin syndrome like irritable; confusion; diarrhea; fast or irregular heartbeat; muscle twitching; stiff muscles; trouble walking; sweating; high fever; seizures; chills; vomiting  Side effects that usually do not require medical attention (report to your doctor or health care professional if they continue or are bothersome):  -diarrhea  -dizziness  -drowsiness  -dry mouth  -headache  -nausea, vomiting  -pain, tingling, numbness in the hands or feet  -stomach pain  This list may not describe all possible side effects. Call your doctor for medical advice about side effects. You may report side effects to FDA at 1-800-FDA-1088.  Where should I keep my medicine?  Keep out of the reach of children.  Store at room temperature between 2 and 30 degrees C (36 and 86 degrees F). Throw away any unused medicine after the expiration date.  NOTE: This sheet is a summary. It may not cover all possible information. If you have questions about this medicine, talk to your doctor, pharmacist, or health care provider.   2019 Elsevier/Gold Standard (2017-09-03 15:05:37)

## 2018-09-03 ENCOUNTER — Ambulatory Visit: Admission: RE | Admit: 2018-09-03 | Payer: BC Managed Care – PPO | Source: Ambulatory Visit

## 2018-09-09 ENCOUNTER — Telehealth (INDEPENDENT_AMBULATORY_CARE_PROVIDER_SITE_OTHER): Payer: BC Managed Care – PPO | Admitting: Physician Assistant

## 2018-09-09 ENCOUNTER — Telehealth: Payer: Self-pay | Admitting: *Deleted

## 2018-09-09 ENCOUNTER — Encounter: Payer: Self-pay | Admitting: Physician Assistant

## 2018-09-09 VITALS — Ht 69.0 in | Wt 320.8 lb

## 2018-09-09 DIAGNOSIS — R112 Nausea with vomiting, unspecified: Secondary | ICD-10-CM

## 2018-09-09 DIAGNOSIS — Z20822 Contact with and (suspected) exposure to covid-19: Secondary | ICD-10-CM

## 2018-09-09 DIAGNOSIS — R197 Diarrhea, unspecified: Secondary | ICD-10-CM | POA: Diagnosis not present

## 2018-09-09 NOTE — Progress Notes (Signed)
       Patient: Jill Moon Female    DOB: 12/17/1990   27 y.o.   MRN: [id number] Visit Date: 09/09/2018  Today's Provider: Trinna Post, PA-C   Chief Complaint  Patient presents with  . Diarrhea   Subjective:    Virtual Visit via Telephone Note  I connected with Jill Moon on 09/09/18 at  9:20 AM EDT by telephone and verified that I am speaking with the correct person using two identifiers.   I discussed the limitations, risks, security and privacy concerns of performing an evaluation and management service by telephone and the availability of in person appointments. I also discussed with the patient that there may be a patient responsible charge related to this service. The patient expressed understanding and agreed to proceed.  Patient location: home Provider location: [place] office  Persons involved in the visit: patient, provider    HPI Patient c/o diarrhea x's 3 days, reports watery stools. Patient denies any blood or mucus in diarrhea. Reports using bathroom 7-8 times after dinner on Saturday. Patient reports eating and drinking well. Reports bland diet. Patient denies taking any OTC medications. Patient reports starting weight watchers last Monday. Denies any recent antibiotics or hospitalizations. Does have a history of cholecystectomy. Has some mild generalized abdominal pain. Most recently, yesterday she did not eat breakfast and still experienced diarrhea five times. Reports she vomited once yesterday. Works in Software engineer. Denies SOB, fevers, chills, sore throat    No Known Allergies   Current Outpatient Medications:  .  Norethindrone Acetate-Ethinyl Estrad-FE (MICROGESTIN 24 FE) 1-20 MG-MCG(24) tablet, Take 1 tablet by mouth daily., Disp: 84 tablet, Rfl: 3 .  ondansetron (ZOFRAN) 4 MG tablet, Take 1 tablet (4 mg total) by mouth every 8 (eight) hours as needed., Disp: 20 tablet, Rfl: 0 .  SUMAtriptan  (IMITREX) 50 MG tablet, Take 1 tablet (50 mg total) by mouth every 2 (two) hours as needed for migraine. No more than 200mg  in 24 hr period, Disp: 10 tablet, Rfl: 0  Review of Systems  Constitutional: Negative.   Cardiovascular: Negative.   Gastrointestinal: Positive for abdominal pain, diarrhea and vomiting.    Social History   Tobacco Use  . Smoking status: Former Research scientist (life sciences)  . Smokeless tobacco: Never Used  . Tobacco comment: Per patient she has not smoke for the past six months  Substance Use Topics  . Alcohol use: Yes    Alcohol/week: 1.0 standard drinks    Types: 1 Shots of liquor per week    Comment: occasional      Objective:   There were no vitals taken for this visit. There were no vitals filed for this visit.   Physical Exam Pulmonary:     Effort: No respiratory distress.      No results found for any visits on 09/09/18.     Assessment & Plan    1. Nausea vomiting and diarrhea  Testing for COVID currently based on symptoms. Wrote work note. Advised on being out of work and symptomatic treatment including zofran and imodium.   The entirety of the information documented in the History of Present Illness, Review of Systems and Physical Exam were personally obtained by me. Portions of this information were initially documented by Lynford Humphrey, CMA and reviewed by me for thoroughness and accuracy.   F/u PRN      Trinna Post, PA-C  [facility]

## 2018-09-09 NOTE — Telephone Encounter (Signed)
Pt scheduled for covid testing 09/10/18 @ 2:30 @ [facility]. Instructions given and order placed

## 2018-09-10 ENCOUNTER — Other Ambulatory Visit: Payer: Self-pay

## 2018-09-10 DIAGNOSIS — Z20822 Contact with and (suspected) exposure to covid-19: Secondary | ICD-10-CM

## 2018-09-10 DIAGNOSIS — R6889 Other general symptoms and signs: Secondary | ICD-10-CM | POA: Diagnosis not present

## 2018-09-14 LAB — NOVEL CORONAVIRUS, NAA: SARS-CoV-2, NAA: NOT DETECTED

## 2018-11-13 ENCOUNTER — Encounter: Payer: Self-pay | Admitting: Physician Assistant

## 2018-11-21 ENCOUNTER — Ambulatory Visit: Payer: Self-pay | Admitting: Physician Assistant

## 2018-11-27 NOTE — Progress Notes (Signed)
Patient: Jill Moon Female    DOB: 1990/06/17   28 y.o.   MRN: [id number] Visit Date: 11/28/2018  Today's Provider: [name], PA-C   No chief complaint on file.  Subjective:     HPI   Patient is here to discuss starting phentermine and metformin to help with weight loss. Patient has lost from 328 pounds to 316 pounds over the last 2 months on her own with diet and exercise.   Patient also having issues with her mood recently. Reports her father is going through a divorce and her mother was laid off from her job, so things have been stressful. She feels she is snapping at people. She has tried sertraline in the past and tolerated it well.   No Known Allergies   Current Outpatient Medications:  .  Norethindrone Acetate-Ethinyl Estrad-FE (MICROGESTIN 24 FE) 1-20 MG-MCG(24) tablet, Take 1 tablet by mouth daily., Disp: 84 tablet, Rfl: 3 .  ondansetron (ZOFRAN) 4 MG tablet, Take 1 tablet (4 mg total) by mouth every 8 (eight) hours as needed., Disp: 20 tablet, Rfl: 0 .  SUMAtriptan (IMITREX) 50 MG tablet, Take 1 tablet (50 mg total) by mouth every 2 (two) hours as needed for migraine. No more than 200mg  in 24 hr period, Disp: 10 tablet, Rfl: 0  Review of Systems  Constitutional: Negative for appetite change, chills, fatigue and fever.  Respiratory: Negative for chest tightness and shortness of breath.   Cardiovascular: Negative for chest pain and palpitations.  Gastrointestinal: Negative for abdominal pain, nausea and vomiting.  Neurological: Negative for dizziness and weakness.  Psychiatric/Behavioral: Positive for agitation and dysphoric mood.    Social History   Tobacco Use  . Smoking status: Former Research scientist (life sciences)  . Smokeless tobacco: Never Used  . Tobacco comment: Per patient she has not smoke for the past six months  Substance Use Topics  . Alcohol use: Yes    Alcohol/week: 1.0 standard drinks    Types: 1 Shots of liquor per week    Comment: occasional      Objective:   BP 123/82 (BP Location: Right Arm, Patient Position: Sitting, Cuff Size: Large)   Pulse 83   Temp 97.7 F (36.5 C) (Other (Comment))   Resp 16   Ht 5\' 9"  (1.753 m)   Wt (!) 316 lb (143.3 kg)   LMP 11/04/2018   SpO2 95%   BMI 46.67 kg/m  Vitals:   11/28/18 0809  BP: 123/82  Pulse: 83  Resp: 16  Temp: 97.7 F (36.5 C)  TempSrc: Other (Comment)  SpO2: 95%  Weight: (!) 316 lb (143.3 kg)  Height: 5\' 9"  (1.753 m)  Body mass index is 46.67 kg/m.   Physical Exam Vitals signs reviewed.  Constitutional:      General: She is not in acute distress.    Appearance: Normal appearance. She is well-developed. She is obese. She is not ill-appearing or diaphoretic.  Neck:     Musculoskeletal: Normal range of motion and neck supple.     Thyroid: No thyromegaly.     Vascular: No JVD.     Trachea: No tracheal deviation.  Cardiovascular:     Rate and Rhythm: Normal rate and regular rhythm.     Heart sounds: Normal heart sounds. No murmur. No friction rub. No gallop.   Pulmonary:     Effort: Pulmonary effort is normal. No respiratory distress.     Breath sounds: Normal breath sounds. No wheezing or rales.  Lymphadenopathy:     Cervical: No cervical adenopathy.  Neurological:     Mental Status: She is alert.  Psychiatric:        Mood and Affect: Mood normal.        Behavior: Behavior normal.        Thought Content: Thought content normal.        Judgment: Judgment normal.      No results found for any visits on 11/28/18.     Assessment & Plan    1. Class 3 severe obesity due to excess calories with serious comorbidity and body mass index (BMI) of 45.0 to 49.9 in adult [facility]) Patient has lost 12 pounds on her own with dieting and exercise. Will start phentermine and metformin as below to help with weight loss. Will see her back in 1-3 months for weight f/u.  - phentermine (ADIPEX-P) 37.5 MG tablet; Take 1 tablet (37.5 mg total) by mouth daily before breakfast.   Dispense: 30 tablet; Refill: 2 - metFORMIN (GLUCOPHAGE) 500 MG tablet; Take 1 tablet (500 mg total) by mouth daily with breakfast.  Dispense: 90 tablet; Refill: 1  2. Encounter for weight loss counseling See above medical treatment plan. - phentermine (ADIPEX-P) 37.5 MG tablet; Take 1 tablet (37.5 mg total) by mouth daily before breakfast.  Dispense: 30 tablet; Refill: 2 - metFORMIN (GLUCOPHAGE) 500 MG tablet; Take 1 tablet (500 mg total) by mouth daily with breakfast.  Dispense: 90 tablet; Refill: 1  3. PCOS (polycystic ovarian syndrome) Complicating weight loss. Will add metformin to help with insulin resistance associated with PCOS.  - phentermine (ADIPEX-P) 37.5 MG tablet; Take 1 tablet (37.5 mg total) by mouth daily before breakfast.  Dispense: 30 tablet; Refill: 2 - metFORMIN (GLUCOPHAGE) 500 MG tablet; Take 1 tablet (500 mg total) by mouth daily with breakfast.  Dispense: 90 tablet; Refill: 1  4. Situational mixed anxiety and depressive disorder Worsening due to external situations. Will restart sertraline as below. F/U in 1-3 months.  - sertraline (ZOLOFT) 50 MG tablet; Take 1 tablet (50 mg total) by mouth daily. Start with 0.5 tab PO q hs x 1 week then increase to 1 tab PO q hs  Dispense: 30 tablet; Refill: 2     Jill Loveless, PA-C  [facility]

## 2018-11-28 ENCOUNTER — Ambulatory Visit: Payer: BC Managed Care – PPO | Admitting: Physician Assistant

## 2018-11-28 ENCOUNTER — Other Ambulatory Visit: Payer: Self-pay

## 2018-11-28 ENCOUNTER — Encounter: Payer: Self-pay | Admitting: Physician Assistant

## 2018-11-28 VITALS — BP 123/82 | HR 83 | Temp 97.7°F | Resp 16 | Ht 69.0 in | Wt 316.0 lb

## 2018-11-28 DIAGNOSIS — Z6841 Body Mass Index (BMI) 40.0 and over, adult: Secondary | ICD-10-CM

## 2018-11-28 DIAGNOSIS — F4323 Adjustment disorder with mixed anxiety and depressed mood: Secondary | ICD-10-CM | POA: Diagnosis not present

## 2018-11-28 DIAGNOSIS — Z713 Dietary counseling and surveillance: Secondary | ICD-10-CM | POA: Diagnosis not present

## 2018-11-28 DIAGNOSIS — E282 Polycystic ovarian syndrome: Secondary | ICD-10-CM | POA: Diagnosis not present

## 2018-11-28 MED ORDER — SERTRALINE HCL 50 MG PO TABS: 50.0000 mg | ORAL_TABLET | Freq: Every day | ORAL | 2 refills | Status: DC

## 2018-11-28 MED ORDER — PHENTERMINE HCL 37.5 MG PO TABS: 37.5000 mg | ORAL_TABLET | Freq: Every day | ORAL | 2 refills | Status: DC

## 2018-11-28 MED ORDER — METFORMIN HCL 500 MG PO TABS: 500.0000 mg | ORAL_TABLET | Freq: Every day | ORAL | 1 refills | Status: DC

## 2018-11-28 NOTE — Patient Instructions (Signed)
Calorie Counting for Weight Loss °Calories are units of energy. Your body needs a certain amount of calories from food to keep you going throughout the day. When you eat more calories than your body needs, your body stores the extra calories as fat. When you eat fewer calories than your body needs, your body burns fat to get the energy it needs. °Calorie counting means keeping track of how many calories you eat and drink each day. Calorie counting can be helpful if you need to lose weight. If you make sure to eat fewer calories than your body needs, you should lose weight. Ask your health care provider what a healthy weight is for you. °For calorie counting to work, you will need to eat the right number of calories in a day in order to lose a healthy amount of weight per week. A dietitian can help you determine how many calories you need in a day and will give you suggestions on how to reach your calorie goal. °· A healthy amount of weight to lose per week is usually 1-2 lb (0.5-0.9 kg). This usually means that your daily calorie intake should be reduced by 500-750 calories. °· Eating 1,200 - 1,500 calories per day can help most women lose weight. °· Eating 1,500 - 1,800 calories per day can help most men lose weight. °What is my plan? °My goal is to have ______1500____ calories per day. °If I have this many calories per day, I should lose around ___1-2_______ pounds per week. °What do I need to know about calorie counting? °In order to meet your daily calorie goal, you will need to: °· Find out how many calories are in each food you would like to eat. Try to do this before you eat. °· Decide how much of the food you plan to eat. °· Write down what you ate and how many calories it had. Doing this is called keeping a food log. °To successfully lose weight, it is important to balance calorie counting with a healthy lifestyle that includes regular activity. Aim for 150 minutes of moderate exercise (such as walking) or  75 minutes of vigorous exercise (such as running) each week. °Where do I find calorie information? ° °The number of calories in a food can be found on a Nutrition Facts label. If a food does not have a Nutrition Facts label, try to look up the calories online or ask your dietitian for help. °Remember that calories are listed per serving. If you choose to have more than one serving of a food, you will have to multiply the calories per serving by the amount of servings you plan to eat. For example, the label on a package of bread might say that a serving size is 1 slice and that there are 90 calories in a serving. If you eat 1 slice, you will have eaten 90 calories. If you eat 2 slices, you will have eaten 180 calories. °How do I keep a food log? °Immediately after each meal, record the following information in your food log: °· What you ate. Don't forget to include toppings, sauces, and other extras on the food. °· How much you ate. This can be measured in cups, ounces, or number of items. °· How many calories each food and drink had. °· The total number of calories in the meal. °Keep your food log near you, such as in a small notebook in your pocket, or use a mobile app or website. Some programs will calculate   calories for you and show you how many calories you have left for the day to meet your goal. °What are some calorie counting tips? ° °· Use your calories on foods and drinks that will fill you up and not leave you hungry: °? Some examples of foods that fill you up are nuts and nut butters, vegetables, lean proteins, and high-fiber foods like whole grains. High-fiber foods are foods with more than 5 g fiber per serving. °? Drinks such as sodas, specialty coffee drinks, alcohol, and juices have a lot of calories, yet do not fill you up. °· Eat nutritious foods and avoid empty calories. Empty calories are calories you get from foods or beverages that do not have many vitamins or protein, such as candy, sweets,  and soda. It is better to have a nutritious high-calorie food (such as an avocado) than a food with few nutrients (such as a bag of chips). °· Know how many calories are in the foods you eat most often. This will help you calculate calorie counts faster. °· Pay attention to calories in drinks. Low-calorie drinks include water and unsweetened drinks. °· Pay attention to nutrition labels for "low fat" or "fat free" foods. These foods sometimes have the same amount of calories or more calories than the full fat versions. They also often have added sugar, starch, or salt, to make up for flavor that was removed with the fat. °· Find a way of tracking calories that works for you. Get creative. Try different apps or programs if writing down calories does not work for you. °What are some portion control tips? °· Know how many calories are in a serving. This will help you know how many servings of a certain food you can have. °· Use a measuring cup to measure serving sizes. You could also try weighing out portions on a kitchen scale. With time, you will be able to estimate serving sizes for some foods. °· Take some time to put servings of different foods on your favorite plates, bowls, and cups so you know what a serving looks like. °· Try not to eat straight from a bag or box. Doing this can lead to overeating. Put the amount you would like to eat in a cup or on a plate to make sure you are eating the right portion. °· Use smaller plates, glasses, and bowls to prevent overeating. °· Try not to multitask (for example, watch TV or use your computer) while eating. If it is time to eat, sit down at a table and enjoy your food. This will help you to know when you are full. It will also help you to be aware of what you are eating and how much you are eating. °What are tips for following this plan? °Reading food labels °· Check the calorie count compared to the serving size. The serving size may be smaller than what you are used  to eating. °· Check the source of the calories. Make sure the food you are eating is high in vitamins and protein and low in saturated and trans fats. °Shopping °· Read nutrition labels while you shop. This will help you make healthy decisions before you decide to purchase your food. °· Make a grocery list and stick to it. °Cooking °· Try to cook your favorite foods in a healthier way. For example, try baking instead of frying. °· Use low-fat dairy products. °Meal planning °· Use more fruits and vegetables. Half of your plate should be fruits   and vegetables. °· Include lean proteins like poultry and fish. °How do I count calories when eating out? °· Ask for smaller portion sizes. °· Consider sharing an entree and sides instead of getting your own entree. °· If you get your own entree, eat only half. Ask for a box at the beginning of your meal and put the rest of your entree in it so you are not tempted to eat it. °· If calories are listed on the menu, choose the lower calorie options. °· Choose dishes that include vegetables, fruits, whole grains, low-fat dairy products, and lean protein. °· Choose items that are boiled, broiled, grilled, or steamed. Stay away from items that are buttered, battered, fried, or served with cream sauce. Items labeled "crispy" are usually fried, unless stated otherwise. °· Choose water, low-fat milk, unsweetened iced tea, or other drinks without added sugar. If you want an alcoholic beverage, choose a lower calorie option such as a glass of wine or light beer. °· Ask for dressings, sauces, and syrups on the side. These are usually high in calories, so you should limit the amount you eat. °· If you want a salad, choose a garden salad and ask for grilled meats. Avoid extra toppings like bacon, cheese, or fried items. Ask for the dressing on the side, or ask for olive oil and vinegar or lemon to use as dressing. °· Estimate how many servings of a food you are given. For example, a serving  of cooked rice is ½ cup or about the size of half a baseball. Knowing serving sizes will help you be aware of how much food you are eating at restaurants. The list below tells you how big or small some common portion sizes are based on everyday objects: °? 1 oz--4 stacked dice. °? 3 oz--1 deck of cards. °? 1 tsp--1 die. °? 1 Tbsp--½ a ping-pong ball. °? 2 Tbsp--1 ping-pong ball. °? ½ cup--½ baseball. °? 1 cup--1 baseball. °Summary °· Calorie counting means keeping track of how many calories you eat and drink each day. If you eat fewer calories than your body needs, you should lose weight. °· A healthy amount of weight to lose per week is usually 1-2 lb (0.5-0.9 kg). This usually means reducing your daily calorie intake by 500-750 calories. °· The number of calories in a food can be found on a Nutrition Facts label. If a food does not have a Nutrition Facts label, try to look up the calories online or ask your dietitian for help. °· Use your calories on foods and drinks that will fill you up, and not on foods and drinks that will leave you hungry. °· Use smaller plates, glasses, and bowls to prevent overeating. °This information is not intended to replace advice given to you by your health care provider. Make sure you discuss any questions you have with your health care provider. °Document Released: 02/19/2005 Document Revised: 11/08/2017 Document Reviewed: 01/20/2016 °Elsevier Patient Education © 2020 [employer] ° °

## 2018-12-25 ENCOUNTER — Ambulatory Visit: Payer: BC Managed Care – PPO | Admitting: Physician Assistant

## 2019-01-12 NOTE — Progress Notes (Deleted)
   {  Method of visit:23308}  Patient: Jill Moon Female    DOB: 11-May-1990   28 y.o.   MRN: [id number] Visit Date: 01/12/2019  Today's Provider: [name], PA-C   No chief complaint on file.  Subjective:     HPI  Obesity: Patient here to follow up obesity. Patient cites {motive:16538} as reasons for wanting to lose weight. History of Weight Loss Efforts Patient has lost 12 pounds on her own with dieting and exercise  Current Exercise Habits {exercise types:16438}    No Known Allergies   Current Outpatient Medications:  .  metFORMIN (GLUCOPHAGE) 500 MG tablet, Take 1 tablet (500 mg total) by mouth daily with breakfast., Disp: 90 tablet, Rfl: 1 .  Norethindrone Acetate-Ethinyl Estrad-FE (MICROGESTIN 24 FE) 1-20 MG-MCG(24) tablet, Take 1 tablet by mouth daily., Disp: 84 tablet, Rfl: 3 .  ondansetron (ZOFRAN) 4 MG tablet, Take 1 tablet (4 mg total) by mouth every 8 (eight) hours as needed., Disp: 20 tablet, Rfl: 0 .  phentermine (ADIPEX-P) 37.5 MG tablet, Take 1 tablet (37.5 mg total) by mouth daily before breakfast., Disp: 30 tablet, Rfl: 2 .  sertraline (ZOLOFT) 50 MG tablet, Take 1 tablet (50 mg total) by mouth daily. Start with 0.5 tab PO q hs x 1 week then increase to 1 tab PO q hs, Disp: 30 tablet, Rfl: 2 .  SUMAtriptan (IMITREX) 50 MG tablet, Take 1 tablet (50 mg total) by mouth every 2 (two) hours as needed for migraine. No more than 200mg  in 24 hr period, Disp: 10 tablet, Rfl: 0  Review of Systems  Social History   Tobacco Use  . Smoking status: Former Research scientist (life sciences)  . Smokeless tobacco: Never Used  . Tobacco comment: Per patient she has not smoke for the past six months  Substance Use Topics  . Alcohol use: Yes    Alcohol/week: 1.0 standard drinks    Types: 1 Shots of liquor per week    Comment: occasional      Objective:   There were no vitals taken for this visit. There were no vitals filed for this visit.There is no height or weight on file to  calculate BMI.   Physical Exam   No results found for any visits on 01/14/19.     Assessment & [place], PA-C  [facility]

## 2019-01-14 ENCOUNTER — Ambulatory Visit: Payer: BC Managed Care – PPO | Admitting: Physician Assistant

## 2019-01-15 DIAGNOSIS — Z20828 Contact with and (suspected) exposure to other viral communicable diseases: Secondary | ICD-10-CM | POA: Diagnosis not present

## 2019-02-17 ENCOUNTER — Other Ambulatory Visit: Payer: Self-pay

## 2019-02-17 MED ORDER — MICROGESTIN 24 FE 1-20 MG-MCG PO TABS: 1.0000 | ORAL_TABLET | Freq: Every day | ORAL | 0 refills | Status: DC

## 2019-02-17 NOTE — Telephone Encounter (Signed)
Pt calling for refill of bc to get her to appt on 03/17/19.  [phone]  Pt aware refill eRx'd c confirmation.

## 2019-03-17 ENCOUNTER — Other Ambulatory Visit: Payer: Self-pay

## 2019-03-17 ENCOUNTER — Encounter: Payer: Self-pay | Admitting: Obstetrics and Gynecology

## 2019-03-17 ENCOUNTER — Other Ambulatory Visit (HOSPITAL_COMMUNITY)
Admission: RE | Admit: 2019-03-17 | Discharge: 2019-03-17 | Disposition: A | Discharge status: Home / Self Care | Payer: BLUE CROSS/BLUE SHIELD | Source: Ambulatory Visit | Attending: Obstetrics and Gynecology | Admitting: Obstetrics and Gynecology

## 2019-03-17 ENCOUNTER — Ambulatory Visit (INDEPENDENT_AMBULATORY_CARE_PROVIDER_SITE_OTHER): Payer: BLUE CROSS/BLUE SHIELD | Admitting: Obstetrics and Gynecology

## 2019-03-17 VITALS — BP 110/90 | Ht 69.0 in | Wt 309.0 lb

## 2019-03-17 DIAGNOSIS — Z113 Encounter for screening for infections with a predominantly sexual mode of transmission: Secondary | ICD-10-CM

## 2019-03-17 DIAGNOSIS — E282 Polycystic ovarian syndrome: Secondary | ICD-10-CM | POA: Insufficient documentation

## 2019-03-17 DIAGNOSIS — Z01419 Encounter for gynecological examination (general) (routine) without abnormal findings: Secondary | ICD-10-CM | POA: Diagnosis not present

## 2019-03-17 DIAGNOSIS — Z3041 Encounter for surveillance of contraceptive pills: Secondary | ICD-10-CM

## 2019-03-17 MED ORDER — MICROGESTIN 24 FE 1-20 MG-MCG PO TABS: 1.0000 | ORAL_TABLET | Freq: Every day | ORAL | 3 refills | Status: DC

## 2019-03-17 NOTE — Patient Instructions (Signed)
I value your feedback and entrusting us with your care. If you get a [facility] patient survey, I would appreciate you taking the time to let us know about your experience today. Thank you!  As of February 12, 2019, your lab results will be released to your MyChart immediately, before I even have a chance to see them. Please give me time to review them and contact you if there are any abnormalities. Thank you for your patience.  

## 2019-03-17 NOTE — Progress Notes (Signed)
PCP:  [name], PA-C   Chief Complaint  Patient presents with  . Gynecologic Exam     HPI:      [name] is a 29 y.o. [id number] who LMP was Patient's last menstrual period was 03/13/2019 (exact date)., presents today for her annual examination.  Her menses are  monthly with OCPs, lasting 4 days, no BTB, mild dysmen, improved with tylenol. Hx of PCOS with amenorrhea and episodes of DUB off OCPs. Sx controlled with OCPs.  Sex activity: not sexually active, but was this past yr. Interested in STD testing. Last Pap: August 25, 2015  Results were: no abnormalities  Hx of STDs: none  There is no FH of breast cancer. There is a FH of ovarian cancer in her pat aunt. Pt is MyRisk neg 2017. The patient does self-breast exams.  Tobacco use: 1 ppd, not ready to quit Alcohol use: none No drug use.  Exercise: not active Has lost 21# since her last appt 9/19.  She does get adequate calcium but not Vitamin D in her diet.   Past Medical History:  Diagnosis Date  . Anxiety   . Boils   . BRCA negative 08/2015   MyRisk neg  . Depression   . Family history of ovarian cancer   . Hirsutism   . Irregular menses   . Irregular periods/menstrual cycles 01/30/2017  . Obesity   . Pap smear of vagina with ASC-US   . Polycystic disease, ovaries     Past Surgical History:  Procedure Laterality Date  . CHOLECYSTECTOMY N/A 08/09/2015   Procedure: LAPAROSCOPIC CHOLECYSTECTOMY WITH INTRAOPERATIVE CHOLANGIOGRAM;  Surgeon: Dia Crawford III, MD;  Location: [facility] ORS;  Service: General;  Laterality: N/A;  . ERCP N/A 08/11/2015   Procedure: ENDOSCOPIC RETROGRADE CHOLANGIOPANCREATOGRAPHY (ERCP);  Surgeon: Lucilla Lame, MD;  Location: [facility] ENDOSCOPY;  Service: Endoscopy;  Laterality: N/A;  . ERCP N/A 11/29/2015   Procedure: ENDOSCOPIC RETROGRADE CHOLANGIOPANCREATOGRAPHY (ERCP) stent removal;  Surgeon: Lucilla Lame, MD;  Location: [facility] ENDOSCOPY;  Service: Endoscopy;  Laterality: N/A;  .  TONSILLECTOMY AND ADENOIDECTOMY      Family History  Problem Relation Age of Onset  . Diabetes Mother        Type 2  . Hypertension Mother   . Hyperlipidemia Mother   . Kidney Stones Father   . Ovarian cancer Paternal Aunt        early 10s, has contact  . Cancer Paternal Grandfather        Liver  . Hypertension Paternal Grandfather     Social History   Socioeconomic History  . Marital status: Single    Spouse name: Not on file  . Number of children: Not on file  . Years of education: Not on file  . Highest education level: Not on file  Occupational History  . Not on file  Tobacco Use  . Smoking status: Former Research scientist (life sciences)  . Smokeless tobacco: Never Used  . Tobacco comment: Per patient she has not smoke for the past six months  Substance and Sexual Activity  . Alcohol use: Yes    Alcohol/week: 1.0 standard drinks    Types: 1 Shots of liquor per week    Comment: occasional  . Drug use: No  . Sexual activity: Yes    Birth control/protection: Pill  Other Topics Concern  . Not on file  Social History Narrative  . Not on file   Social Determinants of Health   Financial Resource Strain:   .  Difficulty of Paying Living Expenses: Not on file  Food Insecurity:   . Worried About Charity fundraiser in the Last Year: Not on file  . Ran Out of Food in the Last Year: Not on file  Transportation Needs:   . Lack of Transportation (Medical): Not on file  . Lack of Transportation (Non-Medical): Not on file  Physical Activity:   . Days of Exercise per Week: Not on file  . Minutes of Exercise per Session: Not on file  Stress:   . Feeling of Stress : Not on file  Social Connections:   . Frequency of Communication with Friends and Family: Not on file  . Frequency of Social Gatherings with Friends and Family: Not on file  . Attends Religious Services: Not on file  . Active Member of Clubs or Organizations: Not on file  . Attends Archivist Meetings: Not on file  .  Marital Status: Not on file  Intimate Partner Violence:   . Fear of Current or Ex-Partner: Not on file  . Emotionally Abused: Not on file  . Physically Abused: Not on file  . Sexually Abused: Not on file    Outpatient Medications Prior to Visit  Medication Sig Dispense Refill  . Norethindrone Acetate-Ethinyl Estrad-FE (MICROGESTIN 24 FE) 1-20 MG-MCG(24) tablet Take 1 tablet by mouth daily. 84 tablet 0  . metFORMIN (GLUCOPHAGE) 500 MG tablet Take 1 tablet (500 mg total) by mouth daily with breakfast. 90 tablet 1  . ondansetron (ZOFRAN) 4 MG tablet Take 1 tablet (4 mg total) by mouth every 8 (eight) hours as needed. 20 tablet 0  . phentermine (ADIPEX-P) 37.5 MG tablet Take 1 tablet (37.5 mg total) by mouth daily before breakfast. 30 tablet 2  . sertraline (ZOLOFT) 50 MG tablet Take 1 tablet (50 mg total) by mouth daily. Start with 0.5 tab PO q hs x 1 week then increase to 1 tab PO q hs 30 tablet 2  . SUMAtriptan (IMITREX) 50 MG tablet Take 1 tablet (50 mg total) by mouth every 2 (two) hours as needed for migraine. No more than 24m in 24 hr period 10 tablet 0   No facility-administered medications prior to visit.     ROS:  Review of Systems  Constitutional: Negative for fatigue, fever and unexpected weight change.  Respiratory: Negative for cough, shortness of breath and wheezing.   Cardiovascular: Negative for chest pain, palpitations and leg swelling.  Gastrointestinal: Negative for blood in stool, constipation, diarrhea, nausea and vomiting.  Endocrine: Negative for cold intolerance, heat intolerance and polyuria.  Genitourinary: Negative for dyspareunia, dysuria, flank pain, frequency, genital sores, hematuria, menstrual problem, pelvic pain, urgency, vaginal bleeding, vaginal discharge and vaginal pain.  Musculoskeletal: Negative for arthralgias, back pain, joint swelling and myalgias.  Skin: Negative for rash.  Neurological: Positive for headaches. Negative for dizziness,  syncope, light-headedness and numbness.  Hematological: Negative for adenopathy.  Psychiatric/Behavioral: Positive for agitation. Negative for confusion, dysphoric mood, sleep disturbance and suicidal ideas. The patient is not nervous/anxious.   BREAST: No symptoms   Objective: BP 110/90   Ht _0  (1.753 m)   Wt (!) 309 lb (140.2 kg)   LMP 03/13/2019 (Exact Date)   BMI 45.63 kg/m    Physical Exam Constitutional:      Appearance: She is well-developed.  Genitourinary:     Vulva, uterus, right adnexa and left adnexa normal.     No vulval lesion or tenderness noted.     Vaginal bleeding present.  No vaginal discharge, erythema or tenderness.     No cervical motion tenderness or polyp.     Uterus is not enlarged or tender.     No right or left adnexal mass present.     Right adnexa not tender.     Left adnexa not tender.  Neck:     Thyroid: No thyromegaly.  Cardiovascular:     Rate and Rhythm: Normal rate and regular rhythm.     Heart sounds: Normal heart sounds. No murmur.  Pulmonary:     Effort: Pulmonary effort is normal.     Breath sounds: Normal breath sounds.  Chest:     Breasts:        Right: No mass, nipple discharge, skin change or tenderness.        Left: No mass, nipple discharge, skin change or tenderness.  Abdominal:     Palpations: Abdomen is soft.     Tenderness: There is no abdominal tenderness. There is no guarding.  Musculoskeletal:        General: Normal range of motion.     Cervical back: Normal range of motion.  Neurological:     General: No focal deficit present.     Mental Status: She is alert and oriented to person, place, and time.     Cranial Nerves: No cranial nerve deficit.  Skin:    General: Skin is warm and dry.  Psychiatric:        Mood and Affect: Mood normal.        Behavior: Behavior normal.        Thought Content: Thought content normal.        Judgment: Judgment normal.  Vitals reviewed.     Assessment/Plan: Encounter  for annual routine gynecological examination  Screening for STD (sexually transmitted disease) - Plan: [place] STD  Encounter for surveillance of contraceptive pills - Plan: Norethindrone Acetate-Ethinyl Estrad-FE (MICROGESTIN 24 FE) 1-20 MG-MCG(24) tablet; Rx RF  PCOS (polycystic ovarian syndrome) - Plan: Norethindrone Acetate-Ethinyl Estrad-FE (MICROGESTIN 24 FE) 1-20 MG-MCG(24) tablet; Menses controlled with OCPs. Cont diet/wt loss changes  Meds ordered this encounter  Medications  . Norethindrone Acetate-Ethinyl Estrad-FE (MICROGESTIN 24 FE) 1-20 MG-MCG(24) tablet    Sig: Take 1 tablet by mouth daily.    Dispense:  84 tablet    Refill:  3    Order Specific Question:   Supervising Provider    Answer:   Gae Dry [[id number]]             GYN counsel adequate intake of calcium and vitamin D, diet and exercise     F/U  Return in about 1 year (around 03/16/2020).  Nyleah Mcginnis B. Lydiana Milley, PA-C 03/17/2019 2:08 PM

## 2019-03-19 LAB — CERVICOVAGINAL ANCILLARY ONLY
Chlamydia: NEGATIVE
Comment: NEGATIVE
Comment: NORMAL
Neisseria Gonorrhea: NEGATIVE

## 2019-04-06 DIAGNOSIS — B349 Viral infection, unspecified: Secondary | ICD-10-CM | POA: Diagnosis not present

## 2019-04-06 DIAGNOSIS — Z20822 Contact with and (suspected) exposure to covid-19: Secondary | ICD-10-CM | POA: Diagnosis not present

## 2019-04-27 ENCOUNTER — Encounter: Payer: Self-pay | Admitting: Physician Assistant

## 2019-04-27 ENCOUNTER — Other Ambulatory Visit: Payer: Self-pay

## 2019-04-27 ENCOUNTER — Ambulatory Visit (INDEPENDENT_AMBULATORY_CARE_PROVIDER_SITE_OTHER): Payer: BLUE CROSS/BLUE SHIELD | Admitting: Physician Assistant

## 2019-04-27 VITALS — Temp 100.9°F | Wt 304.0 lb

## 2019-04-27 DIAGNOSIS — T3695XA Adverse effect of unspecified systemic antibiotic, initial encounter: Secondary | ICD-10-CM

## 2019-04-27 DIAGNOSIS — J0140 Acute pansinusitis, unspecified: Secondary | ICD-10-CM

## 2019-04-27 DIAGNOSIS — B379 Candidiasis, unspecified: Secondary | ICD-10-CM

## 2019-04-27 MED ORDER — FLUCONAZOLE 150 MG PO TABS: 150.0000 mg | ORAL_TABLET | Freq: Once | ORAL | 0 refills | Status: AC

## 2019-04-27 MED ORDER — PREDNISONE 10 MG (21) PO TBPK: ORAL_TABLET | ORAL | 0 refills | Status: DC

## 2019-04-27 MED ORDER — AMOXICILLIN-POT CLAVULANATE 875-125 MG PO TABS: 1.0000 | ORAL_TABLET | Freq: Two times a day (BID) | ORAL | 0 refills | Status: DC

## 2019-04-27 NOTE — Patient Instructions (Signed)
Sinusitis, Adult Sinusitis is inflammation of your sinuses. Sinuses are hollow spaces in the bones around your face. Your sinuses are located:  Around your eyes.  In the middle of your forehead.  Behind your nose.  In your cheekbones. Mucus normally drains out of your sinuses. When your nasal tissues become inflamed or swollen, mucus can become trapped or blocked. This allows bacteria, viruses, and fungi to grow, which leads to infection. Most infections of the sinuses are caused by a virus. Sinusitis can develop quickly. It can last for up to 4 weeks (acute) or for more than 12 weeks (chronic). Sinusitis often develops after a cold. What are the causes? This condition is caused by anything that creates swelling in the sinuses or stops mucus from draining. This includes:  Allergies.  Asthma.  Infection from bacteria or viruses.  Deformities or blockages in your nose or sinuses.  Abnormal growths in the nose (nasal polyps).  Pollutants, such as chemicals or irritants in the air.  Infection from fungi (rare). What increases the risk? You are more likely to develop this condition if you:  Have a weak body defense system (immune system).  Do a lot of swimming or diving.  Overuse nasal sprays.  Smoke. What are the signs or symptoms? The main symptoms of this condition are pain and a feeling of pressure around the affected sinuses. Other symptoms include:  Stuffy nose or congestion.  Thick drainage from your nose.  Swelling and warmth over the affected sinuses.  Headache.  Upper toothache.  A cough that may get worse at night.  Extra mucus that collects in the throat or the back of the nose (postnasal drip).  Decreased sense of smell and taste.  Fatigue.  A fever.  Sore throat.  Bad breath. How is this diagnosed? This condition is diagnosed based on:  Your symptoms.  Your medical history.  A physical exam.  Tests to find out if your condition is  acute or chronic. This may include: ? Checking your nose for nasal polyps. ? Viewing your sinuses using a device that has a light (endoscope). ? Testing for allergies or bacteria. ? Imaging tests, such as an MRI or CT scan. In rare cases, a bone biopsy may be done to rule out more serious types of fungal sinus disease. How is this treated? Treatment for sinusitis depends on the cause and whether your condition is chronic or acute.  If caused by a virus, your symptoms should go away on their own within 10 days. You may be given medicines to relieve symptoms. They include: ? Medicines that shrink swollen nasal passages (topical intranasal decongestants). ? Medicines that treat allergies (antihistamines). ? A spray that eases inflammation of the nostrils (topical intranasal corticosteroids). ? Rinses that help get rid of thick mucus in your nose (nasal saline washes).  If caused by bacteria, your health care provider may recommend waiting to see if your symptoms improve. Most bacterial infections will get better without antibiotic medicine. You may be given antibiotics if you have: ? A severe infection. ? A weak immune system.  If caused by narrow nasal passages or nasal polyps, you may need to have surgery. Follow these instructions at home: Medicines  Take, use, or apply over-the-counter and prescription medicines only as told by your health care provider. These may include nasal sprays.  If you were prescribed an antibiotic medicine, take it as told by your health care provider. Do not stop taking the antibiotic even if you start   to feel better. Hydrate and humidify   Drink enough fluid to keep your urine pale yellow. Staying hydrated will help to thin your mucus.  Use a cool mist humidifier to keep the humidity level in your home above 50%.  Inhale steam for 10-15 minutes, 3-4 times a day, or as told by your health care provider. You can do this in the bathroom while a hot shower is  running.  Limit your exposure to cool or dry air. Rest  Rest as much as possible.  Sleep with your head raised (elevated).  Make sure you get enough sleep each night. General instructions   Apply a warm, moist washcloth to your face 3-4 times a day or as told by your health care provider. This will help with discomfort.  Wash your hands often with soap and water to reduce your exposure to germs. If soap and water are not available, use hand sanitizer.  Do not smoke. Avoid being around people who are smoking (secondhand smoke).  Keep all follow-up visits as told by your health care provider. This is important. Contact a health care provider if:  You have a fever.  Your symptoms get worse.  Your symptoms do not improve within 10 days. Get help right away if:  You have a severe headache.  You have persistent vomiting.  You have severe pain or swelling around your face or eyes.  You have vision problems.  You develop confusion.  Your neck is stiff.  You have trouble breathing. Summary  Sinusitis is soreness and inflammation of your sinuses. Sinuses are hollow spaces in the bones around your face.  This condition is caused by nasal tissues that become inflamed or swollen. The swelling traps or blocks the flow of mucus. This allows bacteria, viruses, and fungi to grow, which leads to infection.  If you were prescribed an antibiotic medicine, take it as told by your health care provider. Do not stop taking the antibiotic even if you start to feel better.  Keep all follow-up visits as told by your health care provider. This is important. This information is not intended to replace advice given to you by your health care provider. Make sure you discuss any questions you have with your health care provider. Document Revised: 07/22/2017 Document Reviewed: 07/22/2017 Elsevier Patient Education  2020 Elsevier Inc.  

## 2019-04-27 NOTE — Progress Notes (Signed)
Patient: Jill Moon Female    DOB: 01-31-1991   28 y.o.   MRN: [id number] Visit Date: 04/27/2019  Today's Provider: Margaretann Loveless, PA-C   Chief Complaint  Patient presents with  . Sinus Problem   Subjective:    Virtual Visit via Video Note  I connected with Jill Moon on 04/27/19 at  4:00 PM EST by a video enabled telemedicine application and verified that I am speaking with the correct person using two identifiers.  Location: Patient: Home Provider: [facility]   I discussed the limitations of evaluation and management by telemedicine and the availability of in person appointments. The patient expressed understanding and agreed to proceed.    I discussed the assessment and treatment plan with the patient. The patient was provided an opportunity to ask questions and all were answered. The patient agreed with the plan and demonstrated an understanding of the instructions.   The patient was advised to call back or seek an in-person evaluation if the symptoms worsen or if the condition fails to improve as anticipated.  Sinus Problem This is a new problem. The current episode started in the past 7 days. The problem is unchanged. There has been no fever. Associated symptoms include congestion, headaches, sinus pressure, sneezing and a sore throat. Pertinent negatives include no chills, coughing, diaphoresis, ear pain, hoarse voice, neck pain, shortness of breath or swollen glands. Treatments tried: Alka-Seltzer Cold& Flu AM/PM and Mucinex Sinus. The treatment provided no relief.    No Known Allergies   Current Outpatient Medications:  .  Norethindrone Acetate-Ethinyl Estrad-FE (MICROGESTIN 24 FE) 1-20 MG-MCG(24) tablet, Take 1 tablet by mouth daily., Disp: 84 tablet, Rfl: 3  Review of Systems  Constitutional: Negative for chills and diaphoresis.  HENT: Positive for congestion, sinus pressure, sneezing and sore throat. Negative for ear pain and hoarse voice.     Respiratory: Negative for cough and shortness of breath.   Musculoskeletal: Negative for neck pain.  Neurological: Positive for headaches.    Social History   Tobacco Use  . Smoking status: Former Games developer  . Smokeless tobacco: Never Used  . Tobacco comment: Per patient she has not smoke for the past six months  Substance Use Topics  . Alcohol use: Yes    Alcohol/week: 1.0 standard drinks    Types: 1 Shots of liquor per week    Comment: occasional      Objective:   Temp (!) 100.9 F (38.3 C) (Oral)   Wt (!) 304 lb (137.9 kg)   BMI 44.89 kg/m  Vitals:   04/27/19 1355  Temp: (!) 100.9 F (38.3 C)  TempSrc: Oral  Weight: (!) 304 lb (137.9 kg)  Body mass index is 44.89 kg/m.   Physical Exam Constitutional:      General: She is not in acute distress.    Appearance: She is ill-appearing.  HENT:     Head: Normocephalic and atraumatic.  Eyes:     General: No scleral icterus. Pulmonary:     Effort: Pulmonary effort is normal. No respiratory distress.  Musculoskeletal:     Cervical back: Normal range of motion.  Neurological:     Mental Status: She is alert.  Psychiatric:        Mood and Affect: Mood normal.        Behavior: Behavior normal.        Thought Content: Thought content normal.      No results found for any visits on 04/27/19.  Assessment & Plan    1. Acute non-recurrent pansinusitis Worsening symptoms that have not responded to OTC medications. Will give augmentin and prednisone as below. Continue allergy medications. Stay well hydrated and get plenty of rest. Call if no symptom improvement or if symptoms worsen. - amoxicillin-clavulanate (AUGMENTIN) 875-125 MG tablet; Take 1 tablet by mouth 2 (two) times daily.  Dispense: 20 tablet; Refill: 0 - predniSONE (STERAPRED UNI-PAK 21 TAB) 10 MG (21) TBPK tablet; 6 day taper; take as directed on package instructions  Dispense: 21 tablet; Refill: 0  2. Antibiotic-induced yeast infection Gets yeast  infections with antibiotics. Diflucan given as below.  - fluconazole (DIFLUCAN) 150 MG tablet; Take 1 tablet (150 mg total) by mouth once for 1 dose.  Dispense: 1 tablet; Refill: 0     I provided 10 minutes of non-face-to-face time during this encounter.  [name], PA-C  [facility]

## 2019-06-22 DIAGNOSIS — Z20828 Contact with and (suspected) exposure to other viral communicable diseases: Secondary | ICD-10-CM | POA: Diagnosis not present

## 2019-06-22 DIAGNOSIS — Z03818 Encounter for observation for suspected exposure to other biological agents ruled out: Secondary | ICD-10-CM | POA: Diagnosis not present

## 2019-06-23 DIAGNOSIS — F3289 Other specified depressive episodes: Secondary | ICD-10-CM | POA: Diagnosis not present

## 2019-06-30 DIAGNOSIS — F3289 Other specified depressive episodes: Secondary | ICD-10-CM | POA: Diagnosis not present

## 2019-07-06 ENCOUNTER — Encounter: Payer: Self-pay | Admitting: Physician Assistant

## 2019-07-06 ENCOUNTER — Ambulatory Visit (INDEPENDENT_AMBULATORY_CARE_PROVIDER_SITE_OTHER): Payer: BLUE CROSS/BLUE SHIELD | Admitting: Physician Assistant

## 2019-07-06 DIAGNOSIS — T3695XA Adverse effect of unspecified systemic antibiotic, initial encounter: Secondary | ICD-10-CM

## 2019-07-06 DIAGNOSIS — B379 Candidiasis, unspecified: Secondary | ICD-10-CM | POA: Diagnosis not present

## 2019-07-06 DIAGNOSIS — K640 First degree hemorrhoids: Secondary | ICD-10-CM | POA: Diagnosis not present

## 2019-07-06 DIAGNOSIS — J029 Acute pharyngitis, unspecified: Secondary | ICD-10-CM | POA: Diagnosis not present

## 2019-07-06 MED ORDER — HYDROCORTISONE ACETATE 25 MG RE SUPP: 25.0000 mg | Freq: Two times a day (BID) | RECTAL | 0 refills | Status: DC

## 2019-07-06 MED ORDER — FLUCONAZOLE 150 MG PO TABS: 150.0000 mg | ORAL_TABLET | Freq: Once | ORAL | 0 refills | Status: AC

## 2019-07-06 MED ORDER — AMOXICILLIN 875 MG PO TABS: 875.0000 mg | ORAL_TABLET | Freq: Two times a day (BID) | ORAL | 0 refills | Status: DC

## 2019-07-06 NOTE — Progress Notes (Signed)
Virtual telephone visit    Virtual Visit via Telephone Note   This visit type was conducted due to national recommendations for restrictions regarding the COVID-19 Pandemic (e.g. social distancing) in an effort to limit this patient's exposure and mitigate transmission in our community. Due to her co-morbid illnesses, this patient is at least at moderate risk for complications without adequate follow up. This format is felt to be most appropriate for this patient at this time. The patient did not have access to video technology or had technical difficulties with video requiring transitioning to audio format only (telephone). Physical exam was limited to content and character of the telephone converstion.    Patient location: Home Provider location: [facility]   Visit Date: 07/06/2019  Today's healthcare provider: [name], PA-C   Chief Complaint  Patient presents with  . Sore Throat   Subjective    Sore Throat  This is a new problem. The current episode started in the past 7 days. The problem has been unchanged. Neither side of throat is experiencing more pain than the other. There has been no fever. The pain is mild (mild to moderate). Associated symptoms include ear pain, headaches and neck pain. Pertinent negatives include no congestion, diarrhea, shortness of breath, trouble swallowing or vomiting. She has tried acetaminophen (Advil and Tylenol) for the symptoms.  Possible exposure to covid 19  Patient Active Problem List   Diagnosis Date Noted  . PCOS (polycystic ovarian syndrome) 03/17/2019  . DUB (dysfunctional uterine bleeding) 01/30/2017  . S/P cholecystectomy 01/31/2016  . BMI 40.0-44.9, adult ([place]) 06/28/2015  . Morbid obesity due to excess calories ([place]) 06/28/2015  . Abortion, spontaneous 05/05/2015  . Clinical depression 05/05/2015  . Irregular menstrual cycle 05/05/2015   Past Medical History:  Diagnosis Date  . Anxiety   . Boils   . BRCA negative  08/2015   MyRisk neg  . Depression   . Family history of ovarian cancer   . Hirsutism   . Irregular menses   . Irregular periods/menstrual cycles 01/30/2017  . Obesity   . Pap smear of vagina with ASC-US   . Polycystic disease, ovaries       Medications: Outpatient Medications Prior to Visit  Medication Sig  . Norethindrone Acetate-Ethinyl Estrad-FE (MICROGESTIN 24 FE) 1-20 MG-MCG(24) tablet Take 1 tablet by mouth daily.  Marland Kitchen amoxicillin-clavulanate (AUGMENTIN) 875-125 MG tablet Take 1 tablet by mouth 2 (two) times daily.  . predniSONE (STERAPRED UNI-PAK 21 TAB) 10 MG (21) TBPK tablet 6 day taper; take as directed on package instructions   No facility-administered medications prior to visit.    Review of Systems  Constitutional: Negative for fever.  HENT: Positive for ear pain and sore throat. Negative for congestion, postnasal drip, rhinorrhea, sinus pressure, sinus pain and trouble swallowing.   Respiratory: Negative for chest tightness, shortness of breath and wheezing.   Gastrointestinal: Negative for diarrhea and vomiting.  Musculoskeletal: Positive for neck pain.  Neurological: Positive for headaches.    Last CBC Lab Results  Component Value Date   WBC 7.5 10/10/2016   HGB 13.0 10/10/2016   HCT 39.5 10/10/2016   MCV 80 10/10/2016   MCH 26.3 (L) 10/10/2016   RDW 15.3 10/10/2016   PLT 293 29/92/4268   Last metabolic panel Lab Results  Component Value Date   GLUCOSE 120 (H) 08/22/2015   NA 138 08/22/2015   K 3.8 08/22/2015   CL 106 08/22/2015   CO2 22 08/22/2015   BUN 8 08/22/2015  CREATININE 0.75 08/22/2015   GFRNONAA >60 08/22/2015   GFRAA >60 08/22/2015   CALCIUM 9.6 08/22/2015   PROT 8.1 08/22/2015   ALBUMIN 3.8 08/22/2015   BILITOT 0.2 (L) 08/22/2015   ALKPHOS 26 (L) 08/22/2015   AST 32 08/22/2015   ALT 42 08/22/2015   ANIONGAP 10 08/22/2015      Objective    There were no vitals taken for this visit. BP Readings from Last 3 Encounters:    03/17/19 110/90  11/28/18 123/82  08/21/18 132/82   Wt Readings from Last 3 Encounters:  04/27/19 (!) 304 lb (137.9 kg)  03/17/19 (!) 309 lb (140.2 kg)  11/28/18 (!) 316 lb (143.3 kg)        Assessment & Plan     1. Pharyngitis, unspecified etiology Worsening symptoms that have not responded to OTC medications. Will give amoxil as below. Continue allergy medications. Stay well hydrated and get plenty of rest. Call if no symptom improvement or if symptoms worsen. - amoxicillin (AMOXIL) 875 MG tablet; Take 1 tablet (875 mg total) by mouth 2 (two) times daily.  Dispense: 20 tablet; Refill: 0  2. Grade I hemorrhoids Noted by patient. Failed OTC treatments. Will give anusol suppositories as below. Call and come in for appt if not improving.  - hydrocortisone (ANUSOL-HC) 25 MG suppository; Place 1 suppository (25 mg total) rectally 2 (two) times daily.  Dispense: 12 suppository; Refill: 0  3. Antibiotic-induced yeast infection Gets yeast infections with antibiotics. Diflucan given as below.  - fluconazole (DIFLUCAN) 150 MG tablet; Take 1 tablet (150 mg total) by mouth once for 1 dose.  Dispense: 1 tablet; Refill: 0   No follow-ups on file.    I discussed the assessment and treatment plan with the patient. The patient was provided an opportunity to ask questions and all were answered. The patient agreed with the plan and demonstrated an understanding of the instructions.   The patient was advised to call back or seek an in-person evaluation if the symptoms worsen or if the condition fails to improve as anticipated.  I provided 10 minutes of non-face-to-face time during this encounter.  Reynolds Bowl, PA-C, have reviewed all documentation for this visit. The documentation on 07/06/19 for the exam, diagnosis, procedures, and orders are all accurate and complete.  [place] [facility] [phone] (phone) [phone] (fax)  [place]

## 2019-07-06 NOTE — Patient Instructions (Signed)

## 2019-07-10 ENCOUNTER — Ambulatory Visit: Payer: BLUE CROSS/BLUE SHIELD | Attending: Internal Medicine

## 2019-07-10 DIAGNOSIS — Z20822 Contact with and (suspected) exposure to covid-19: Secondary | ICD-10-CM | POA: Diagnosis not present

## 2019-07-11 LAB — SARS-COV-2, NAA 2 DAY TAT

## 2019-07-11 LAB — NOVEL CORONAVIRUS, NAA: SARS-CoV-2, NAA: NOT DETECTED

## 2019-07-14 DIAGNOSIS — F3289 Other specified depressive episodes: Secondary | ICD-10-CM | POA: Diagnosis not present

## 2019-07-21 DIAGNOSIS — F3289 Other specified depressive episodes: Secondary | ICD-10-CM | POA: Diagnosis not present

## 2019-07-28 DIAGNOSIS — F3289 Other specified depressive episodes: Secondary | ICD-10-CM | POA: Diagnosis not present

## 2019-08-03 ENCOUNTER — Other Ambulatory Visit: Payer: Self-pay | Admitting: Obstetrics and Gynecology

## 2019-08-03 DIAGNOSIS — E282 Polycystic ovarian syndrome: Secondary | ICD-10-CM

## 2019-08-03 DIAGNOSIS — Z3041 Encounter for surveillance of contraceptive pills: Secondary | ICD-10-CM

## 2019-08-04 DIAGNOSIS — Z20822 Contact with and (suspected) exposure to covid-19: Secondary | ICD-10-CM | POA: Diagnosis not present

## 2019-08-04 DIAGNOSIS — Z03818 Encounter for observation for suspected exposure to other biological agents ruled out: Secondary | ICD-10-CM | POA: Diagnosis not present

## 2019-08-11 DIAGNOSIS — F3289 Other specified depressive episodes: Secondary | ICD-10-CM | POA: Diagnosis not present

## 2019-08-18 DIAGNOSIS — F3289 Other specified depressive episodes: Secondary | ICD-10-CM | POA: Diagnosis not present

## 2019-09-08 DIAGNOSIS — F3289 Other specified depressive episodes: Secondary | ICD-10-CM | POA: Diagnosis not present

## 2019-09-15 DIAGNOSIS — F3289 Other specified depressive episodes: Secondary | ICD-10-CM | POA: Diagnosis not present

## 2019-09-22 DIAGNOSIS — F3289 Other specified depressive episodes: Secondary | ICD-10-CM | POA: Diagnosis not present

## 2019-10-06 DIAGNOSIS — F3289 Other specified depressive episodes: Secondary | ICD-10-CM | POA: Diagnosis not present

## 2019-10-20 DIAGNOSIS — F3289 Other specified depressive episodes: Secondary | ICD-10-CM | POA: Diagnosis not present

## 2019-11-10 DIAGNOSIS — F3289 Other specified depressive episodes: Secondary | ICD-10-CM | POA: Diagnosis not present

## 2019-11-23 DIAGNOSIS — F3289 Other specified depressive episodes: Secondary | ICD-10-CM | POA: Diagnosis not present

## 2019-12-07 DIAGNOSIS — F3289 Other specified depressive episodes: Secondary | ICD-10-CM | POA: Diagnosis not present

## 2019-12-21 DIAGNOSIS — F3289 Other specified depressive episodes: Secondary | ICD-10-CM | POA: Diagnosis not present

## 2020-01-04 DIAGNOSIS — F3289 Other specified depressive episodes: Secondary | ICD-10-CM | POA: Diagnosis not present

## 2020-01-18 DIAGNOSIS — F3289 Other specified depressive episodes: Secondary | ICD-10-CM | POA: Diagnosis not present

## 2020-01-18 DIAGNOSIS — Z20822 Contact with and (suspected) exposure to covid-19: Secondary | ICD-10-CM | POA: Diagnosis not present

## 2020-02-01 DIAGNOSIS — F3289 Other specified depressive episodes: Secondary | ICD-10-CM | POA: Diagnosis not present

## 2020-02-05 DIAGNOSIS — Z23 Encounter for immunization: Secondary | ICD-10-CM | POA: Diagnosis not present

## 2020-03-14 DIAGNOSIS — F3289 Other specified depressive episodes: Secondary | ICD-10-CM | POA: Diagnosis not present

## 2020-04-11 DIAGNOSIS — F3289 Other specified depressive episodes: Secondary | ICD-10-CM | POA: Diagnosis not present

## 2020-04-25 DIAGNOSIS — F3289 Other specified depressive episodes: Secondary | ICD-10-CM | POA: Diagnosis not present

## 2020-05-09 DIAGNOSIS — F3289 Other specified depressive episodes: Secondary | ICD-10-CM | POA: Diagnosis not present

## 2020-05-23 DIAGNOSIS — F3289 Other specified depressive episodes: Secondary | ICD-10-CM | POA: Diagnosis not present

## 2020-06-06 DIAGNOSIS — F3289 Other specified depressive episodes: Secondary | ICD-10-CM | POA: Diagnosis not present

## 2020-07-04 DIAGNOSIS — F3289 Other specified depressive episodes: Secondary | ICD-10-CM | POA: Diagnosis not present

## 2020-07-22 DIAGNOSIS — F3289 Other specified depressive episodes: Secondary | ICD-10-CM | POA: Diagnosis not present

## 2020-08-15 DIAGNOSIS — F3289 Other specified depressive episodes: Secondary | ICD-10-CM | POA: Diagnosis not present

## 2020-08-29 DIAGNOSIS — F3289 Other specified depressive episodes: Secondary | ICD-10-CM | POA: Diagnosis not present

## 2020-09-12 DIAGNOSIS — F3289 Other specified depressive episodes: Secondary | ICD-10-CM | POA: Diagnosis not present

## 2020-09-26 DIAGNOSIS — F3289 Other specified depressive episodes: Secondary | ICD-10-CM | POA: Diagnosis not present

## 2020-10-24 DIAGNOSIS — F3289 Other specified depressive episodes: Secondary | ICD-10-CM | POA: Diagnosis not present

## 2020-11-22 DIAGNOSIS — F3289 Other specified depressive episodes: Secondary | ICD-10-CM | POA: Diagnosis not present

## 2020-12-12 DIAGNOSIS — F3289 Other specified depressive episodes: Secondary | ICD-10-CM | POA: Diagnosis not present

## 2021-01-09 DIAGNOSIS — F3289 Other specified depressive episodes: Secondary | ICD-10-CM | POA: Diagnosis not present

## 2021-02-06 DIAGNOSIS — E668 Other obesity: Secondary | ICD-10-CM | POA: Diagnosis not present

## 2021-02-09 NOTE — Progress Notes (Signed)
Established patient visit   Patient: Jill Moon   DOB: [birth year]   30 y.o. Female  MRN: [id number] Visit Date: 02/10/2021  Today's healthcare provider: Jacky Kindle, FNP   Chief Complaint  Patient presents with   Obesity    Patient would like to discuss concerns of weight gain, she reports history of PCOS and family history of DM. Patient reports that she has tried Phentermine and Metformin, and states that she had successful weight loss. Patient states that she is currently enrolled in weight watchers and wants to discuss about starting Ozempic. Patient reports that she is seeing a therapist for symptoms of anxiety/depression possibly being related to over eating.    Subjective    HPI HPI     Obesity    Additional comments: Patient would like to discuss concerns of weight gain, she reports history of PCOS and family history of DM. Patient reports that she has tried Phentermine and Metformin, and states that she had successful weight loss. Patient states that she is currently enrolled in weight watchers and wants to discuss about starting Ozempic. Patient reports that she is seeing a therapist for symptoms of anxiety/depression possibly being related to over eating.       Last edited by Fonda Kinder, CMA on 02/10/2021  2:22 PM.       Medications: Outpatient Medications Prior to Visit  Medication Sig   [DISCONTINUED] Norethindrone Acetate-Ethinyl Estrad-FE (MICROGESTIN 24 FE) 1-20 MG-MCG(24) tablet Take 1 tablet by mouth daily.   [DISCONTINUED] amoxicillin (AMOXIL) 875 MG tablet Take 1 tablet (875 mg total) by mouth 2 (two) times daily.   [DISCONTINUED] hydrocortisone (ANUSOL-HC) 25 MG suppository Place 1 suppository (25 mg total) rectally 2 (two) times daily. (Patient not taking: Reported on 02/10/2021)   No facility-administered medications prior to visit.    Review of Systems  Last CBC Lab Results  Component Value Date   WBC 7.5 10/10/2016   HGB 13.0  10/10/2016   HCT 39.5 10/10/2016   MCV 80 10/10/2016   MCH 26.3 (L) 10/10/2016   RDW 15.3 10/10/2016   PLT 293 10/10/2016   Last metabolic panel Lab Results  Component Value Date   GLUCOSE 120 (H) 08/22/2015   NA 138 08/22/2015   K 3.8 08/22/2015   CL 106 08/22/2015   CO2 22 08/22/2015   BUN 8 08/22/2015   CREATININE 0.75 08/22/2015   GFRNONAA >60 08/22/2015   CALCIUM 9.6 08/22/2015   PROT 8.1 08/22/2015   ALBUMIN 3.8 08/22/2015   BILITOT 0.2 (L) 08/22/2015   ALKPHOS 26 (L) 08/22/2015   AST 32 08/22/2015   ALT 42 08/22/2015   ANIONGAP 10 08/22/2015   Last hemoglobin A1c Lab Results  Component Value Date   HGBA1C 5.5 01/30/2017   Last thyroid functions Lab Results  Component Value Date   TSH 2.240 01/30/2017     Objective    BP 120/73   Pulse 78   Resp 16   Ht 5' 9.5" (1.765 m)   Wt (!) 312 lb 14.4 oz (141.9 kg)   SpO2 99%   BMI 45.54 kg/m  BP Readings from Last 3 Encounters:  02/10/21 120/73  03/17/19 110/90  11/28/18 123/82   Wt Readings from Last 3 Encounters:  02/10/21 (!) 312 lb 14.4 oz (141.9 kg)  04/27/19 (!) 304 lb (137.9 kg)  03/17/19 (!) 309 lb (140.2 kg)      Physical Exam Vitals and nursing note reviewed.  Constitutional:  General: She is not in acute distress.    Appearance: Normal appearance. She is obese. She is not ill-appearing, toxic-appearing or diaphoretic.  HENT:     Head: Normocephalic and atraumatic.  Cardiovascular:     Rate and Rhythm: Normal rate and regular rhythm.     Pulses: Normal pulses.     Heart sounds: Normal heart sounds. No murmur heard.   No friction rub. No gallop.  Pulmonary:     Effort: Pulmonary effort is normal. No respiratory distress.     Breath sounds: Normal breath sounds. No stridor. No wheezing, rhonchi or rales.  Chest:     Chest wall: No tenderness.  Abdominal:     General: Bowel sounds are normal.     Palpations: Abdomen is soft.  Musculoskeletal:        General: No swelling,  tenderness, deformity or signs of injury. Normal range of motion.     Right lower leg: No edema.     Left lower leg: No edema.  Skin:    General: Skin is warm and dry.     Capillary Refill: Capillary refill takes less than 2 seconds.     Coloration: Skin is not jaundiced or pale.     Findings: No bruising, erythema, lesion or rash.  Neurological:     General: No focal deficit present.     Mental Status: She is alert and oriented to person, place, and time. Mental status is at baseline.     Cranial Nerves: No cranial nerve deficit.     Sensory: No sensory deficit.     Motor: No weakness.     Coordination: Coordination normal.  Psychiatric:        Mood and Affect: Mood normal.        Behavior: Behavior normal.        Thought Content: Thought content normal.        Judgment: Judgment normal.      No results found for any visits on 02/10/21.  Assessment & Plan     Problem List Items Addressed This Visit       Endocrine   PCOS (polycystic ovarian syndrome) - Primary    Restart metformin- recommend slow start      Relevant Medications   metFORMIN (GLUCOPHAGE XR) 750 MG 24 hr tablet     Other   Screening for diabetes mellitus    Normal A1c despite PCOS and morbid obesity      Relevant Orders   POCT HgB A1C   BMI 45.0-49.9, adult (HCC)    BMI 45 Discussed importance of healthy weight management Discussed diet and exercise       Relevant Medications   phentermine 37.5 MG capsule   metFORMIN (GLUCOPHAGE XR) 750 MG 24 hr tablet   Other Relevant Orders   Amb Ref to Medical Weight Management   POCT HgB A1C   Morbid obesity (HCC)    Current BMI 45 Was down to 272# prior to death of family member Has joined [employer] with aunt- has been doing for 2 weeks      Relevant Medications   phentermine 37.5 MG capsule   metFORMIN (GLUCOPHAGE XR) 750 MG 24 hr tablet   Other Relevant Orders   Amb Ref to Medical Weight Management   Binge eating disorder    Restarted SSRI to help       Relevant Medications   buPROPion (WELLBUTRIN SR) 150 MG 12 hr tablet     Return in about 3 months (around  05/11/2021) for annual examination.      Leilani Merl, FNP, have reviewed all documentation for this visit. The documentation on 02/10/21 for the exam, diagnosis, procedures, and orders are all accurate and complete.    Jacky Kindle, FNP  [facility] [phone] (phone) [phone] (fax)  [facility]

## 2021-02-10 ENCOUNTER — Ambulatory Visit (INDEPENDENT_AMBULATORY_CARE_PROVIDER_SITE_OTHER): Payer: BLUE CROSS/BLUE SHIELD | Admitting: Family Medicine

## 2021-02-10 ENCOUNTER — Encounter: Payer: Self-pay | Admitting: Family Medicine

## 2021-02-10 ENCOUNTER — Other Ambulatory Visit: Payer: Self-pay

## 2021-02-10 VITALS — BP 120/73 | HR 78 | Resp 16 | Ht 69.5 in | Wt 312.9 lb

## 2021-02-10 DIAGNOSIS — Z6841 Body Mass Index (BMI) 40.0 and over, adult: Secondary | ICD-10-CM

## 2021-02-10 DIAGNOSIS — Z131 Encounter for screening for diabetes mellitus: Secondary | ICD-10-CM | POA: Diagnosis not present

## 2021-02-10 DIAGNOSIS — F5081 Binge eating disorder: Secondary | ICD-10-CM

## 2021-02-10 DIAGNOSIS — E282 Polycystic ovarian syndrome: Secondary | ICD-10-CM | POA: Diagnosis not present

## 2021-02-10 DIAGNOSIS — F50819 Binge eating disorder, unspecified: Secondary | ICD-10-CM | POA: Insufficient documentation

## 2021-02-10 LAB — POCT GLYCOSYLATED HEMOGLOBIN (HGB A1C): Hemoglobin A1C: 5.1 % (ref 4.0–5.6)

## 2021-02-10 MED ORDER — METFORMIN HCL ER 750 MG PO TB24: 750.0000 mg | ORAL_TABLET | Freq: Two times a day (BID) | ORAL | 3 refills | Status: DC

## 2021-02-10 MED ORDER — PHENTERMINE HCL 37.5 MG PO CAPS: 37.5000 mg | ORAL_CAPSULE | ORAL | 0 refills | Status: DC

## 2021-02-10 MED ORDER — BUPROPION HCL ER (SR) 150 MG PO TB12: 150.0000 mg | ORAL_TABLET | Freq: Two times a day (BID) | ORAL | 3 refills | Status: DC

## 2021-02-10 NOTE — Assessment & Plan Note (Addendum)
Restarted SSRI to help

## 2021-02-10 NOTE — Assessment & Plan Note (Signed)
Current BMI 45 Was down to 272# prior to death of family member Has joined [employer] with aunt- has been doing for 2 weeks

## 2021-02-10 NOTE — Assessment & Plan Note (Signed)
BMI 45 °Discussed importance of healthy weight management °Discussed diet and exercise ° °

## 2021-02-10 NOTE — Assessment & Plan Note (Signed)
Normal A1c despite PCOS and morbid obesity

## 2021-02-10 NOTE — Assessment & Plan Note (Signed)
Restart metformin- recommend slow start

## 2021-03-15 DIAGNOSIS — R0982 Postnasal drip: Secondary | ICD-10-CM | POA: Diagnosis not present

## 2021-05-12 ENCOUNTER — Encounter: Payer: BLUE CROSS/BLUE SHIELD | Admitting: Family Medicine

## 2021-05-24 NOTE — Progress Notes (Deleted)
? ? ? ?Complete physical exam ? ? ?Patient: Jill Moon   DOB: 1990/09/18   30 y.o. Female  MRN: [id number] ?Visit Date: 05/25/2021 ? ?Today's healthcare provider: Gwyneth Sprout, FNP  ? ?No chief complaint on file. ? ?Subjective  ?  ?Jill Moon is a 31 y.o. female who presents today for a complete physical exam.  ?She reports consuming a {diet types:17450} diet. {Exercise:19826} She generally feels {well/fairly well/poorly:18703}. She reports sleeping {well/fairly well/poorly:18703}. She {does/does not:200015} have additional problems to discuss today.  ?HPI  ?*** ? ?Past Medical History:  ?Diagnosis Date  ? Anxiety   ? Boils   ? BRCA negative 08/2015  ? MyRisk neg  ? Depression   ? Family history of ovarian cancer   ? Hirsutism   ? Irregular menses   ? Irregular periods/menstrual cycles 01/30/2017  ? Obesity   ? Pap smear of vagina with ASC-US   ? Polycystic disease, ovaries   ? ?Past Surgical History:  ?Procedure Laterality Date  ? CHOLECYSTECTOMY N/A 08/09/2015  ? Procedure: LAPAROSCOPIC CHOLECYSTECTOMY WITH INTRAOPERATIVE CHOLANGIOGRAM;  Surgeon: Dia Crawford III, MD;  Location: [facility] ORS;  Service: General;  Laterality: N/A;  ? ERCP N/A 08/11/2015  ? Procedure: ENDOSCOPIC RETROGRADE CHOLANGIOPANCREATOGRAPHY (ERCP);  Surgeon: Lucilla Lame, MD;  Location: [facility] ENDOSCOPY;  Service: Endoscopy;  Laterality: N/A;  ? ERCP N/A 11/29/2015  ? Procedure: ENDOSCOPIC RETROGRADE CHOLANGIOPANCREATOGRAPHY (ERCP) stent removal;  Surgeon: Lucilla Lame, MD;  Location: [facility] ENDOSCOPY;  Service: Endoscopy;  Laterality: N/A;  ? TONSILLECTOMY AND ADENOIDECTOMY    ? ?Social History  ? ?Socioeconomic History  ? Marital status: Single  ?  Spouse name: Not on file  ? Number of children: Not on file  ? Years of education: Not on file  ? Highest education level: Not on file  ?Occupational History  ? Not on file  ?Tobacco Use  ? Smoking status: Former  ? Smokeless tobacco: Never  ? Tobacco comments:  ?  Per patient she has not smoke for  the past six months  ?Vaping Use  ? Vaping Use: Never used  ?Substance and Sexual Activity  ? Alcohol use: Yes  ?  Alcohol/week: 1.0 standard drink  ?  Types: 1 Shots of liquor per week  ?  Comment: occasional  ? Drug use: No  ? Sexual activity: Yes  ?  Birth control/protection: Pill  ?Other Topics Concern  ? Not on file  ?Social History Narrative  ? Not on file  ? ?Social Determinants of Health  ? ?Financial Resource Strain: Not on file  ?Food Insecurity: Not on file  ?Transportation Needs: Not on file  ?Physical Activity: Not on file  ?Stress: Not on file  ?Social Connections: Not on file  ?Intimate Partner Violence: Not on file  ? ?Family Status  ?Relation Name Status  ? Mother  Alive  ? Father  Alive  ?     MI and renal stones  ? [place]  ?     Kidney stones  ? MGM  (Not Specified)  ?     CVA  ? MGF  Deceased  ?     Myocardial infarction  ? PGF  Deceased  ? ?Family History  ?Problem Relation Age of Onset  ? Diabetes Mother   ?     Type 2  ? Hypertension Mother   ? Hyperlipidemia Mother   ? Kidney Stones Father   ? Ovarian cancer Paternal Aunt   ?  early 18s, has contact  ? Cancer Paternal Grandfather   ?     Liver  ? Hypertension Paternal Grandfather   ? ?No Known Allergies  ?Patient Care Team: ?Gwyneth Sprout, FNP as PCP - General (Family Medicine)  ? ?Medications: ?Outpatient Medications Prior to Visit  ?Medication Sig  ? buPROPion (WELLBUTRIN SR) 150 MG 12 hr tablet Take 1 tablet (150 mg total) by mouth 2 (two) times daily.  ? metFORMIN (GLUCOPHAGE XR) 750 MG 24 hr tablet Take 1 tablet (750 mg total) by mouth 2 (two) times daily with breakfast and lunch.  ? phentermine 37.5 MG capsule Take 1 capsule (37.5 mg total) by mouth every morning.  ? ?No facility-administered medications prior to visit.  ? ? ?Review of Systems ? ?{Labs  Heme  Chem  Endocrine  Serology  Results Review (optional):23779} ? Objective  ?  ?There were no vitals taken for this visit. ?{Show previous vital signs  (optional):23777} ? ? ?Physical Exam  ?*** ? ?Last depression screening scores ? ?  02/10/2021  ?  2:28 PM 04/26/2016  ?  8:07 AM 05/31/2015  ?  9:17 AM  ?PHQ 2/9 Scores  ?PHQ - 2 Score 2 0 2  ?PHQ- 9 Score 4  13  ? ?Last fall risk screening ?   ? View : No data to display.  ?  ?  ?  ? ?Last Audit-C alcohol use screening ? ?  02/10/2021  ?  2:30 PM  ?Alcohol Use Disorder Test (AUDIT)  ?1. How often do you have a drink containing alcohol? 2  ?2. How many drinks containing alcohol do you have on a typical day when you are drinking? 1  ?3. How often do you have six or more drinks on one occasion? 0  ?AUDIT-C Score 3  ? ?A score of 3 or more in women, and 4 or more in men indicates increased risk for alcohol abuse, EXCEPT if all of the points are from question 1  ? ?No results found for any visits on 05/25/21. ? Assessment & Plan  ?  ?Routine Health Maintenance and Physical Exam ? ?Exercise Activities and Dietary recommendations ? Goals   ?None ?  ? ? ?Immunization History  ?Administered Date(s) Administered  ? DTaP 09/27/2005  ? HPV Quadrivalent 09/27/2005, 12/07/2005, 04/22/2006  ? Tdap 10/19/2009  ? ? ?Health Maintenance  ?Topic Date Due  ? COVID-19 Vaccine (1) Never done  ? HIV Screening  Never done  ? Hepatitis C Screening  Never done  ? TETANUS/TDAP  10/20/2019  ? PAP SMEAR-Modifier  11/20/2020  ? INFLUENZA VACCINE  06/03/2021 (Originally 10/03/2020)  ? HPV VACCINES  Completed  ? ? ?Discussed health benefits of physical activity, and encouraged her to engage in regular exercise appropriate for her age and condition. ? ?*** ? ?No follow-ups on file.  ?  ? ?{provider attestation***:1} ? ? ?Gwyneth Sprout, FNP  ?[place] ?[phone] (phone) ?[phone] (fax) ? ?[facility] ?

## 2021-05-25 ENCOUNTER — Encounter: Payer: BLUE CROSS/BLUE SHIELD | Admitting: Family Medicine

## 2021-11-30 DIAGNOSIS — J349 Unspecified disorder of nose and nasal sinuses: Secondary | ICD-10-CM | POA: Diagnosis not present

## 2021-12-13 DIAGNOSIS — J40 Bronchitis, not specified as acute or chronic: Secondary | ICD-10-CM | POA: Diagnosis not present

## 2021-12-17 DIAGNOSIS — R051 Acute cough: Secondary | ICD-10-CM | POA: Diagnosis not present

## 2022-02-28 DIAGNOSIS — J0100 Acute maxillary sinusitis, unspecified: Secondary | ICD-10-CM | POA: Diagnosis not present

## 2022-02-28 DIAGNOSIS — Z0000 Encounter for general adult medical examination without abnormal findings: Secondary | ICD-10-CM | POA: Diagnosis not present

## 2022-02-28 DIAGNOSIS — E282 Polycystic ovarian syndrome: Secondary | ICD-10-CM | POA: Diagnosis not present

## 2022-03-04 DIAGNOSIS — J069 Acute upper respiratory infection, unspecified: Secondary | ICD-10-CM | POA: Diagnosis not present

## 2022-03-14 DIAGNOSIS — Z131 Encounter for screening for diabetes mellitus: Secondary | ICD-10-CM | POA: Diagnosis not present

## 2022-03-14 DIAGNOSIS — Z13220 Encounter for screening for lipoid disorders: Secondary | ICD-10-CM | POA: Diagnosis not present

## 2022-08-06 DIAGNOSIS — R7303 Prediabetes: Secondary | ICD-10-CM | POA: Diagnosis not present

## 2022-08-06 DIAGNOSIS — N926 Irregular menstruation, unspecified: Secondary | ICD-10-CM | POA: Diagnosis not present

## 2022-08-06 DIAGNOSIS — R7989 Other specified abnormal findings of blood chemistry: Secondary | ICD-10-CM | POA: Diagnosis not present

## 2022-08-06 DIAGNOSIS — E282 Polycystic ovarian syndrome: Secondary | ICD-10-CM | POA: Diagnosis not present

## 2023-09-03 ENCOUNTER — Encounter: Admitting: Family Medicine

## 2023-09-11 ENCOUNTER — Encounter: Payer: Self-pay | Admitting: Family Medicine

## 2023-09-11 ENCOUNTER — Ambulatory Visit (INDEPENDENT_AMBULATORY_CARE_PROVIDER_SITE_OTHER): Payer: Self-pay | Admitting: Family Medicine

## 2023-09-11 VITALS — BP 113/80 | HR 77 | Ht 69.0 in | Wt 314.0 lb

## 2023-09-11 DIAGNOSIS — Z6841 Body Mass Index (BMI) 40.0 and over, adult: Secondary | ICD-10-CM | POA: Diagnosis not present

## 2023-09-11 DIAGNOSIS — F411 Generalized anxiety disorder: Secondary | ICD-10-CM

## 2023-09-11 DIAGNOSIS — E282 Polycystic ovarian syndrome: Secondary | ICD-10-CM

## 2023-09-11 DIAGNOSIS — F50811 Binge eating disorder, moderate: Secondary | ICD-10-CM | POA: Diagnosis not present

## 2023-09-11 LAB — POCT URINE PREGNANCY: Preg Test, Ur: NEGATIVE

## 2023-09-11 MED ORDER — FLUOXETINE HCL 20 MG PO CAPS: 20.0000 mg | ORAL_CAPSULE | Freq: Every day | ORAL | 3 refills | Status: AC

## 2023-09-11 MED ORDER — TOPIRAMATE 25 MG PO TABS: 25.0000 mg | ORAL_TABLET | Freq: Two times a day (BID) | ORAL | 2 refills | Status: DC

## 2023-09-11 MED ORDER — PHENTERMINE HCL 37.5 MG PO CAPS: 37.5000 mg | ORAL_CAPSULE | ORAL | 0 refills | Status: DC

## 2023-09-11 NOTE — Patient Instructions (Signed)
 [email or web address]
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
## 2023-09-11 NOTE — Assessment & Plan Note (Signed)
 Polycystic Ovary Syndrome (PCOS) Chronic PCOS contributes to difficulty in losing weight. Experiences hirsutism and concerns about fertility. Acknowledges challenges PCOS presents in weight management. - Monitor PCOS symptoms and manage weight as part of obesity treatment plan

## 2023-09-11 NOTE — Assessment & Plan Note (Signed)
 Generalized Anxiety Disorder Chronic Reports ongoing anxiety and worry, exacerbated by life stressors. Currently on fluoxetine  10 mg, feels insufficient. Anxiety more prominent than depression. Agrees to increase fluoxetine  to 20 mg to better manage symptoms. Anxiety impacts daily life and decision-making. - Increase fluoxetine  to 20 mg once daily - Recommend finding a new therapist using [email or web address] - Schedule follow-up in one month

## 2023-09-11 NOTE — Assessment & Plan Note (Signed)
 Obesity Chronic BMI is 46.37, indicating obesity. PCOS complicates weight loss. Previously tried metformin , Wellbutrin , phentermine , and compounded semaglutide. Currently follows Weight Watchers and exercises regularly. Desires weight loss for self-esteem and health. Discussed potential use of Zepbound (tirzepatide) pending insurance approval. Temporary use of phentermine  and Topamax  (topiramate ) to manage cravings and binge eating. Phentermine  known to work but not ideal for long-term use. Topamax  may help with appetite reduction. - Update insurance information for prior authorization for Zepbound (tirzepatide) - Prescribe phentermine  37.5 mg once daily - Prescribe Topamax  25 mg twice daily - Order A1c, thyroid  panel, and metabolic panel - Schedule follow-up in one month

## 2023-09-12 ENCOUNTER — Ambulatory Visit: Payer: Self-pay | Admitting: Family Medicine

## 2023-11-06 ENCOUNTER — Ambulatory Visit: Admitting: Family Medicine

## 2023-11-06 ENCOUNTER — Encounter: Payer: Self-pay | Admitting: Family Medicine

## 2023-11-06 VITALS — BP 118/80 | HR 73 | Ht 69.0 in | Wt 307.1 lb

## 2023-11-06 DIAGNOSIS — Z6841 Body Mass Index (BMI) 40.0 and over, adult: Secondary | ICD-10-CM | POA: Diagnosis not present

## 2023-11-06 DIAGNOSIS — K644 Residual hemorrhoidal skin tags: Secondary | ICD-10-CM

## 2023-11-06 DIAGNOSIS — F411 Generalized anxiety disorder: Secondary | ICD-10-CM | POA: Diagnosis not present

## 2023-11-06 DIAGNOSIS — F19982 Other psychoactive substance use, unspecified with psychoactive substance-induced sleep disorder: Secondary | ICD-10-CM

## 2023-11-06 DIAGNOSIS — F50811 Binge eating disorder, moderate: Secondary | ICD-10-CM | POA: Diagnosis not present

## 2023-11-06 DIAGNOSIS — E282 Polycystic ovarian syndrome: Secondary | ICD-10-CM

## 2023-11-06 MED ORDER — TOPIRAMATE 50 MG PO TABS: 50.0000 mg | ORAL_TABLET | Freq: Every day | ORAL | 2 refills | Status: AC

## 2023-11-06 MED ORDER — PHENTERMINE HCL 37.5 MG PO CAPS
37.5000 mg | ORAL_CAPSULE | ORAL | 0 refills | Status: AC
Start: 2023-11-06 — End: ?

## 2023-11-06 MED ORDER — PHENTERMINE HCL 37.5 MG PO CAPS: 37.5000 mg | ORAL_CAPSULE | ORAL | 0 refills | Status: DC

## 2023-11-06 MED ORDER — TRAZODONE HCL 50 MG PO TABS
25.0000 mg | ORAL_TABLET | Freq: Every evening | ORAL | 3 refills | Status: AC | PRN
Start: 2023-11-06 — End: ?

## 2023-11-06 MED ORDER — NITROGLYCERIN 0.4 % RE OINT: TOPICAL_OINTMENT | RECTAL | 2 refills | Status: AC

## 2023-11-06 MED ORDER — PHENTERMINE HCL 37.5 MG PO CAPS: 37.5000 mg | ORAL_CAPSULE | ORAL | 0 refills | Status: AC

## 2023-11-06 MED ORDER — METFORMIN HCL ER 750 MG PO TB24: 750.0000 mg | ORAL_TABLET | Freq: Two times a day (BID) | ORAL | 3 refills | Status: AC

## 2023-11-06 NOTE — Progress Notes (Signed)
 Established patient visit   Patient: Jill Moon   DOB: 11-Mar-1990   33 y.o. Female  MRN: [id number] Visit Date: 11/06/2023  Today's healthcare provider: Rockie Agent, MD   Chief Complaint  Patient presents with   Follow-up    Weight management   Insomnia    Patient is not sure if it is from the phentermine  and Topiramate  or from the increased on the Prozac ,   Subjective     HPI     Follow-up    Additional comments: Weight management        Insomnia    Additional comments: Patient is not sure if it is from the phentermine  and Topiramate  or from the increased on the Prozac ,      Last edited by Jill Moon, CMA on 11/06/2023  8:23 AM.       Discussed the use of AI scribe software for clinical note transcription with the patient, who gave verbal consent to proceed.  History of Present Illness Jill Moon is a 33 year old female with chronic obesity and binge eating disorder who presents for follow-up.  She has chronic obesity with a BMI of 45 and currently weighs 307 pounds, a decrease of 7 pounds from her last visit two months ago. Her home scale often shows a slightly higher weight. She is taking phentermine  37.5 mg daily, which has helped reduce her cravings, and Topamax  25 mg, with a potential increase to 50 mg twice daily. She previously took metformin  750 mg twice a day and is willing to restart it as it aided in weight loss.  She has a history of binge eating disorder and notes a significant decrease in cravings with her current medication regimen. She no longer feels the need to satisfy specific food cravings as she did before.  She experiences insomnia, specifically waking up at 3 or 4 AM and being unable to return to sleep. She takes phentermine  between 8 and 10 AM and Prozac  with it, as well as Topamax  in the morning and at night. She attributes some sleep issues to increased work stress.  She has a history of PCOS, which causes  irregular menstrual cycles. Recently, she experienced a 74-day gap between periods, affecting her ability to plan activities such as a beach trip.  She reports having hemorrhoids, which were aggravated during her last menstrual period. She used Preparation H ointment and wipes to manage symptoms, including some bleeding, and the condition has since improved.     Past Medical History:  Diagnosis Date   Anxiety    Boils    BRCA negative 08/2015   MyRisk neg   Depression    Family history of ovarian cancer    Hirsutism    Irregular menses    Irregular periods/menstrual cycles 01/30/2017   Obesity    Pap smear of vagina with ASC-US     Polycystic disease, ovaries     Medications: Outpatient Medications Prior to Visit  Medication Sig   FLUoxetine  (PROZAC ) 20 MG capsule Take 1 capsule (20 mg total) by mouth daily.   [DISCONTINUED] phentermine  37.5 MG capsule Take 1 capsule (37.5 mg total) by mouth every morning.   [DISCONTINUED] topiramate  (TOPAMAX ) 25 MG tablet Take 1 tablet (25 mg total) by mouth 2 (two) times daily.   [DISCONTINUED] buPROPion  (WELLBUTRIN  SR) 150 MG 12 hr tablet Take 1 tablet (150 mg total) by mouth 2 (two) times daily. (Patient not taking: Reported on 09/11/2023)   [DISCONTINUED] metFORMIN  (  GLUCOPHAGE  XR) 750 MG 24 hr tablet Take 1 tablet (750 mg total) by mouth 2 (two) times daily with breakfast and lunch. (Patient not taking: Reported on 09/11/2023)   No facility-administered medications prior to visit.    Review of Systems      Objective    BP 118/80 (BP Location: Left Arm, Patient Position: Sitting, Cuff Size: Large)   Pulse 73   Ht 5' 9 (1.753 m)   Wt (!) 307 lb 1.6 oz (139.3 kg)   SpO2 99%   BMI 45.35 kg/m    [employer] Visit from 11/06/2023 in [facility]  1 49 inches   BP Readings from Last 3 Encounters:  11/06/23 118/80  09/11/23 113/80  02/10/21 120/73   Wt Readings from Last 3 Encounters:  11/06/23 (!)  307 lb 1.6 oz (139.3 kg)  09/11/23 (!) 314 lb (142.4 kg)  02/10/21 (!) 312 lb 14.4 oz (141.9 kg)        Physical Exam  General: Alert, no acute distress Cardio: RRR, no r/m/g Pulm: CTAB, normal work of breathing    No results found for any visits on 11/06/23.  Assessment & Plan     Problem List Items Addressed This Visit     Binge eating disorder   BMI 45.0-49.9, adult (HCC) - Primary   Relevant Medications   topiramate  (TOPAMAX ) 50 MG tablet   phentermine  37.5 MG capsule   phentermine  37.5 MG capsule (Start on 12/04/2023)   phentermine  37.5 MG capsule   phentermine  37.5 MG capsule (Start on 02/03/2024)   metFORMIN  (GLUCOPHAGE  XR) 750 MG 24 hr tablet   Other Relevant Orders   TSH+T4F+T3Free   Hemoglobin A1c   CMP14+EGFR   GAD (generalized anxiety disorder)   Relevant Medications   traZODone  (DESYREL ) 50 MG tablet   Morbid obesity (HCC)   Relevant Medications   phentermine  37.5 MG capsule   phentermine  37.5 MG capsule (Start on 12/04/2023)   phentermine  37.5 MG capsule   phentermine  37.5 MG capsule (Start on 02/03/2024)   metFORMIN  (GLUCOPHAGE  XR) 750 MG 24 hr tablet   PCOS (polycystic ovarian syndrome)   Relevant Medications   phentermine  37.5 MG capsule   phentermine  37.5 MG capsule (Start on 12/04/2023)   phentermine  37.5 MG capsule   phentermine  37.5 MG capsule (Start on 02/03/2024)   metFORMIN  (GLUCOPHAGE  XR) 750 MG 24 hr tablet   Other Visit Diagnoses       Drug-induced insomnia (HCC)       Relevant Medications   traZODone  (DESYREL ) 50 MG tablet     External hemorrhoid       Relevant Medications   Nitroglycerin  0.4 % OINT       Assessment and Plan Assessment & Plan Morbid obesity with binge eating disorder and polycystic ovarian syndrome (PCOS) Chronic conditions  Morbid obesity with a BMI of 45, associated with binge eating disorder and PCOS. Weight has decreased by 7 pounds since the last visit two months ago. Reports improved control over food  cravings and some improvement in stress and anxiety. Non-scale victories such as changes in clothing fit and ring size suggest potential body composition changes. - Continue phentermine  37.5 mg daily in the morning. - Reintroduce metformin  750 mg twice daily in the morning. - Continue Prozac  20 mg daily in the morning. - Adjust Topamax  to 50 mg at night to utilize its sedative effects. - Encourage tracking waist circumference as a non-scale measure of progress. - Encourage continued exercise and  dietary modifications.  Generalized anxiety disorder Chronic condition  Generalized anxiety disorder with increased stress due to work responsibilities. Reports some improvement in anxiety symptoms with current medication regimen. Stress may be contributing to sleep disturbances. - Continue Prozac  20 mg daily in the morning. - Monitor anxiety symptoms and adjust treatment as needed.  Insomnia New diagnosis, suspect this is 2/2 to  medications for weight management  Insomnia characterized by difficulty maintaining sleep, possibly related to stress and medication timing. Reports waking up at 3-4 AM, possibly due to stress from increased work responsibilities. - Adjust Topamax  to 50 mg at night to utilize its sedative effects. - Consider adding trazodone  25-50 mg at night if sleep does not improve with current adjustments.  Symptomatic hemorrhoids Chronic intermittent external hemorrhoids  Symptomatic hemorrhoids, recently exacerbated during menstruation with some bleeding. - Prescribe nitroglycerin  0.4% rectal ointment for symptomatic relief. - Advise on continued use of Preparation H ointment and wipes as needed.  Follow-Up - Schedule follow-up appointment in December. - Return for lab work including thyroid  panel, metabolic panel, and A1c.     Return in about 3 months (around 02/05/2024) for Weight MGMT.         Rockie Agent, MD  [facility] [phone] (phone) [phone] (fax)  [facility]

## 2023-11-07 ENCOUNTER — Other Ambulatory Visit: Payer: Self-pay | Admitting: Family Medicine

## 2023-11-07 DIAGNOSIS — E282 Polycystic ovarian syndrome: Secondary | ICD-10-CM

## 2023-11-07 DIAGNOSIS — Z6841 Body Mass Index (BMI) 40.0 and over, adult: Secondary | ICD-10-CM

## 2024-02-10 ENCOUNTER — Ambulatory Visit: Admitting: Family Medicine

## 2024-02-17 ENCOUNTER — Ambulatory Visit: Admitting: Family Medicine

## 2024-02-17 ENCOUNTER — Encounter: Payer: Self-pay | Admitting: Family Medicine

## 2024-02-17 VITALS — BP 120/88 | HR 72 | Ht 69.0 in | Wt 323.7 lb

## 2024-02-17 DIAGNOSIS — N6459 Other signs and symptoms in breast: Secondary | ICD-10-CM

## 2024-02-17 NOTE — Progress Notes (Signed)
 Established patient visit   Patient: Jill Moon   DOB: [birth year]   33 y.o. Female  MRN: [id number] Visit Date: 02/17/2024  Today's healthcare provider: Rockie Agent, MD   Chief Complaint  Patient presents with   Medical Management of Chronic Issues    Patient is present for weight mgmt f/u, currently taking phentermine     Breast Mass    Patient found lump in left breast on Saturday, does not hurt, around the size of a quarter and would like to have this examined. No family hx of breast ca   Subjective     HPI     Medical Management of Chronic Issues    Additional comments: Patient is present for weight mgmt f/u, currently taking phentermine          Breast Mass    Additional comments: Patient found lump in left breast on Saturday, does not hurt, around the size of a quarter and would like to have this examined. No family hx of breast ca      Last edited by Cherry Chiquita HERO, CMA on 02/17/2024 10:55 AM.       Discussed the use of AI scribe software for clinical note transcription with the patient, who gave verbal consent to proceed.  History of Present Illness Jill Moon is a 33 year old female who presents with a concern for a breast mass in her left breast.  She noticed a mass in her left breast on Saturday. The mass is approximately the size of a quarter. There is no associated pain. She is anxious about the mass due to a family history of cancer on her father's side, although there is no family history of breast cancer.  She has a history of polycystic ovary syndrome (PCOS) and experiences fatigue and stress, which she attributes to work and holiday pressures. She is currently on phentermine  37.5 mg daily for class three obesity but has been inconsistent with its use. She reports a weight gain of 10-15 pounds since her last visit and is exploring a calorie deficit app with friends to help manage her weight. She is also taking Prozac  and  Topamax , which help with cravings.  She experiences dry skin and itching, particularly on her body except for her face, and has been working on increasing her water intake. She recently started seeing a chiropractor for right-sided pain, suspected to be sciatic nerve pain, and has found relief from the chiropractic treatment.     Past Medical History:  Diagnosis Date   Anxiety    Boils    BRCA negative 08/2015   MyRisk neg   Depression    Family history of ovarian cancer    Hirsutism    Irregular menses    Irregular periods/menstrual cycles 01/30/2017   Obesity    Pap smear of vagina with ASC-US     Polycystic disease, ovaries     Medications: Show/hide medication list[1]  Review of Systems      Objective    BP 120/88 (BP Location: Left Arm, Patient Position: Sitting, Cuff Size: Normal)   Pulse 72   Ht 5' 9 (1.753 m)   Wt (!) 323 lb 11.2 oz (146.8 kg)   LMP 01/07/2024 (Approximate)   SpO2 100%   BMI 47.80 kg/m  BP Readings from Last 3 Encounters:  02/17/24 120/88  11/06/23 118/80  09/11/23 113/80   Wt Readings from Last 3 Encounters:  02/17/24 (!) 323 lb 11.2 oz (146.8 kg)  11/06/23 ROLLEN)  307 lb 1.6 oz (139.3 kg)  09/11/23 (!) 314 lb (142.4 kg)        Physical Exam Chest:       Physical Exam GENERAL: Anxious appearing. BREAST: Right breast normal. Left breast with an irregular, firm, fixed nodule at 5 o'clock, tender to palpation. No overlying skin changes. No enlarged lymph nodes in left axilla.    No results found for any visits on 02/17/24.  Assessment & Plan     Problem List Items Addressed This Visit   None Visit Diagnoses       Abnormal breast finding    -  Primary   Relevant Orders   MM 3D DIAGNOSTIC MAMMOGRAM BILATERAL BREAST   US  LIMITED ULTRASOUND INCLUDING AXILLA LEFT BREAST    US  LIMITED ULTRASOUND INCLUDING AXILLA RIGHT BREAST       Assessment and Plan Assessment & Plan Left breast mass,new problem Irregularly shaped,  firm, fixed nodule at the five o'clock position, approximately two inches in length, tender to palpation. No overlying skin changes or enlarged lymph nodes in the left axilla. Tenderness is reassuring, but further imaging is warranted to rule out malignancy. - Ordered diagnostic mammogram and bilateral breast ultrasound - Advised to monitor for changes in size or tenderness of the mass - Recommended over-the-counter Advil for tenderness  Morbid obesity Chronic  Class III obesity with recent weight gain of 10-15 pounds. Currently on phentermine  37.5 mg daily but reports inconsistent use. Expresses interest in starting a GLP-1 agonist, potentially covered by new insurance plan starting January 2026. Engaged in lifestyle modifications including increased water intake and use of a calorie deficit app. - Continue phentermine  37.5 mg daily - Encouraged consistent use of phentermine  - Supported lifestyle modifications including increased water intake and use of calorie deficit app - Discussed potential initiation of GLP-1 agonist with new insurance coverage  Polycystic ovary syndrome Chronic  PCOS with associated symptoms of fatigue and stress.  Major depressive disorder Chronic  Currently managed with Prozac  20 mg daily. Reports increased stress due to work and family concerns. Prefers to maintain current dose unless symptoms worsen. - Continue Prozac  20 mg daily - Monitor for worsening symptoms and will consider dose adjustment if necessary  General health maintenance Routine health maintenance discussed. Pap smear collected during this visit. - Scheduled follow-up in one year for routine Pap smear     Return in about 3 months (around 05/17/2024) for Chronic F/U, Anxiety, Weight MGMT.         Rockie Agent, MD  [facility] [phone] (phone) [phone] (fax)  [facility]     [1]  Outpatient Medications Prior to Visit   Medication Sig   FLUoxetine  (PROZAC ) 20 MG capsule Take 1 capsule (20 mg total) by mouth daily.   metFORMIN  (GLUCOPHAGE  XR) 750 MG 24 hr tablet Take 1 tablet (750 mg total) by mouth 2 (two) times daily with breakfast and lunch.   Nitroglycerin  0.4 % OINT Apply 1 inch (375 mg) ointment intra-anally every 12 hours for anal fissure   phentermine  37.5 MG capsule Take 1 capsule (37.5 mg total) by mouth every morning.   topiramate  (TOPAMAX ) 50 MG tablet Take 1 tablet (50 mg total) by mouth daily.   traZODone  (DESYREL ) 50 MG tablet Take 0.5-1 tablets (25-50 mg total) by mouth at bedtime as needed for sleep.   [DISCONTINUED] phentermine  37.5 MG capsule Take 1 capsule (37.5 mg total) by mouth every morning.   [DISCONTINUED] phentermine  37.5 MG capsule  Take 1 capsule (37.5 mg total) by mouth every morning.   [DISCONTINUED] phentermine  37.5 MG capsule Take 1 capsule (37.5 mg total) by mouth every morning.   No facility-administered medications prior to visit.

## 2024-02-17 NOTE — Patient Instructions (Signed)
 To keep you healthy, please keep in mind the following health maintenance items that you are due for:   Health Maintenance Due  Topic Date Due   HIV Screening  Never done   Hepatitis C Screening  Never done   DTaP/Tdap/Td (9 - Td or Tdap) 10/20/2019   Cervical Cancer Screening (HPV/Pap Cotest)  11/20/2020     Best Wishes,   Dr. Lang

## 2024-02-18 ENCOUNTER — Ambulatory Visit: Payer: Self-pay | Admitting: Family Medicine

## 2024-02-18 ENCOUNTER — Encounter: Payer: Self-pay | Admitting: Family Medicine

## 2024-02-18 LAB — HEMOGLOBIN A1C
Est. average glucose Bld gHb Est-mCnc: 117 mg/dL
Hgb A1c MFr Bld: 5.7 % — ABNORMAL HIGH (ref 4.8–5.6)

## 2024-02-18 LAB — TSH+T4F+T3FREE
Free T4: 1.08 ng/dL (ref 0.82–1.77)
T3, Free: 3.5 pg/mL (ref 2.0–4.4)
TSH: 2.16 u[IU]/mL (ref 0.450–4.500)

## 2024-02-18 LAB — CMP14+EGFR
ALT: 18 IU/L (ref 0–32)
AST: 17 IU/L (ref 0–40)
Albumin: 4.1 g/dL (ref 3.9–4.9)
Alkaline Phosphatase: 25 IU/L — ABNORMAL LOW (ref 41–116)
BUN/Creatinine Ratio: 14 (ref 9–23)
BUN: 10 mg/dL (ref 6–20)
Bilirubin Total: 0.2 mg/dL (ref 0.0–1.2)
CO2: 22 mmol/L (ref 20–29)
Calcium: 9.3 mg/dL (ref 8.7–10.2)
Chloride: 100 mmol/L (ref 96–106)
Creatinine, Ser: 0.7 mg/dL (ref 0.57–1.00)
Globulin, Total: 2.6 g/dL (ref 1.5–4.5)
Glucose: 84 mg/dL (ref 70–99)
Potassium: 4.6 mmol/L (ref 3.5–5.2)
Sodium: 139 mmol/L (ref 134–144)
Total Protein: 6.7 g/dL (ref 6.0–8.5)
eGFR: 117 mL/min/1.73 (ref >59)

## 2024-02-19 ENCOUNTER — Inpatient Hospital Stay: Admission: RE | Admit: 2024-02-19 | Discharge: 2024-02-19 | Attending: Family Medicine

## 2024-02-19 ENCOUNTER — Ambulatory Visit
Admission: RE | Admit: 2024-02-19 | Discharge: 2024-02-19 | Disposition: A | Discharge status: Home / Self Care | Source: Ambulatory Visit | Attending: Family Medicine | Admitting: Family Medicine

## 2024-02-19 ENCOUNTER — Encounter: Payer: Self-pay | Admitting: Radiology

## 2024-02-19 DIAGNOSIS — N6459 Other signs and symptoms in breast: Secondary | ICD-10-CM

## 2024-03-02 ENCOUNTER — Other Ambulatory Visit: Payer: Self-pay | Admitting: Family Medicine

## 2024-03-02 DIAGNOSIS — N63 Unspecified lump in unspecified breast: Secondary | ICD-10-CM

## 2024-03-10 ENCOUNTER — Ambulatory Visit

## 2024-03-20 ENCOUNTER — Ambulatory Visit: Payer: Self-pay

## 2024-03-20 NOTE — Telephone Encounter (Signed)
 FYI Only or Action Required?: FYI only for provider: UC/ED advised.  Patient was last seen in primary care on 02/17/2024 by Sharma Coyer, MD.  Called Nurse Triage reporting Flank Pain.  Symptoms began a week ago.  Interventions attempted: Nothing.  Symptoms are: gradually worsening.  Triage Disposition: Go to ED Now (Notify PCP)  Patient/caregiver understands and will follow disposition?: Yes         Message from Axtell T sent at 03/20/2024  4:11 PM EST  Reason for Triage: right side kidney pain, level 9-10   Reason for Disposition  [1] SEVERE pain (e.g., excruciating, scale 8-10) AND [2] present > 1 hour  Answer Assessment - Initial Assessment Questions 1. LOCATION: Where does it hurt? (e.g., left, right)     right side flank pain  2. ONSET: When did the pain start?     X 1 week   3. SEVERITY: How bad is the pain? (e.g., Scale 1-10; mild, moderate, or severe)     Severe   4. PATTERN: Does the pain come and go, or is it constant?      Constant   5. CAUSE: What do you think is causing the pain?     Possible UTI, unsure   6. OTHER SYMPTOMS:  Do you have any other symptoms? (e.g., fever, abdomen pain, vomiting, leg weakness, burning with urination, blood in urine)     Retaining urine per the mother.   7. PREGNANCY:  Is there any chance you are pregnant? When was your last menstrual period?    No      Patient's mother called in to triage with complaints of right side flank pain This has been ongoing for 1 week.  The mom stated it might be a UTI, but she's unsure as she is not with the patient during triage call. For home care, the patient is not taking anything over the counter.   ED/UC advised for severe flank pain, per Epic the soonest available appointment is 1/28. Mom agrees with the plan of care.  Protocols used: Flank Pain-A-AH

## 2024-03-23 ENCOUNTER — Ambulatory Visit
Admission: RE | Admit: 2024-03-23 | Discharge: 2024-03-23 | Disposition: A | Discharge status: Home / Self Care | Payer: Self-pay | Attending: Emergency Medicine | Admitting: Emergency Medicine

## 2024-03-23 ENCOUNTER — Ambulatory Visit

## 2024-03-23 VITALS — BP 120/83 | HR 82 | Temp 98.3°F | Resp 16 | Wt 312.8 lb

## 2024-03-23 DIAGNOSIS — R10A1 Flank pain, right side: Secondary | ICD-10-CM

## 2024-03-23 LAB — POCT URINE DIPSTICK
Bilirubin, UA: NEGATIVE
Blood, UA: NEGATIVE
Glucose, UA: NEGATIVE mg/dL
Ketones, POC UA: NEGATIVE mg/dL
Nitrite, UA: NEGATIVE
Protein Ur, POC: NEGATIVE mg/dL
Spec Grav, UA: 1.025
Urobilinogen, UA: 0.2 U/dL
pH, UA: 6.5

## 2024-03-23 MED ORDER — BACLOFEN 10 MG PO TABS: 10.0000 mg | ORAL_TABLET | Freq: Three times a day (TID) | ORAL | 0 refills | Status: AC

## 2024-03-23 MED ORDER — IBUPROFEN 600 MG PO TABS: 600.0000 mg | ORAL_TABLET | Freq: Four times a day (QID) | ORAL | 0 refills | Status: AC | PRN

## 2024-03-23 NOTE — ED Triage Notes (Signed)
 Pt states I am having back pain, Ive had a kidney stone before but Im not sure this is what this is. - Entered by patient

## 2024-03-23 NOTE — Discharge Instructions (Addendum)
 Your urine showed trace leukocyte esterase which may be skin contamination.  No other evidence of UTI and no blood to suggest a kidney stone.  However, I will send the urine for culture just to rule out a subclinical infection.  Your x-ray did not show any evidence of kidney stone or other concerning process which could explain your pain.  Given the intermittent nature of your pain, and the fact that it increases with movement, it may be deep musculoskeletal.  Take the ibuprofen , 600 mg every 6 hours with food, on a schedule for the next 48 hours and then as needed.  Take the ibuprofen  1 hour apart from your Prozac .  Take the baclofen , 10 mg every 8 hours, on a schedule for the next 48 hours and then as needed.  Apply moist heat to your back for 30 minutes at a time 2-3 times a day to improve blood flow to the area and help remove the lactic acid causing the spasm.  Follow the back exercises given at discharge.  Return for reevaluation for any new or worsening symptoms.

## 2024-03-23 NOTE — ED Provider Notes (Signed)
 " [facility]    CSN: [id number] Arrival date & time: 03/23/24  0805      History   Chief Complaint Chief Complaint  Patient presents with   Back Pain    HPI Jill Moon is a 34 y.o. female.   HPI  34 year old female with past medical history significant for obesity, PCOS, GAD, prediabetes, and renal stones presents for evaluation of waxing and waning right flank pain.  She reports that is typically not present in the morning but increases throughout the day.  Certain positions can provide pain relief and certain movements and increase in pain.  There is no associated painful urination, urgency, frequency, or hematuria.  No nausea or vomiting.  Patient reports that this pain is different than her previous kidney stone.  Past Medical History:  Diagnosis Date   Anxiety    Boils    BRCA negative 08/2015   MyRisk neg   Depression    Family history of ovarian cancer    Hirsutism    Irregular menses    Irregular periods/menstrual cycles 01/30/2017   Obesity    Pap smear of vagina with ASC-US     Polycystic disease, ovaries     Patient Active Problem List   Diagnosis Date Noted   GAD (generalized anxiety disorder) 09/11/2023   Prediabetes 08/06/2022   Screening for diabetes mellitus 02/10/2021   BMI 45.0-49.9, adult (HCC) 02/10/2021   Morbid obesity (HCC) 02/10/2021   Binge eating disorder 02/10/2021   PCOS (polycystic ovarian syndrome) 03/17/2019   DUB (dysfunctional uterine bleeding) 01/30/2017   S/P cholecystectomy 01/31/2016   BMI 40.0-44.9, adult (HCC) 06/28/2015   Morbid obesity due to excess calories (HCC) 06/28/2015   Abortion, spontaneous 05/05/2015   Clinical depression 05/05/2015   Irregular menstrual cycle 05/05/2015    Past Surgical History:  Procedure Laterality Date   CHOLECYSTECTOMY N/A 08/09/2015   Procedure: LAPAROSCOPIC CHOLECYSTECTOMY WITH INTRAOPERATIVE CHOLANGIOGRAM;  Surgeon: Elgin Laurence III, MD;  Location: [facility] ORS;  Service:  General;  Laterality: N/A;   ERCP N/A 08/11/2015   Procedure: ENDOSCOPIC RETROGRADE CHOLANGIOPANCREATOGRAPHY (ERCP);  Surgeon: Rogelia Copping, MD;  Location: [facility] END[facility]COPY;  Service: Endoscopy;  Laterality: N/A;   ERCP N/A 11/29/2015   Procedure: ENDOSCOPIC RETROGRADE CHOLANGIOPANCREATOGRAPHY (ERCP) stent removal;  Surgeon: Rogelia Copping, MD;  Location: [facility] ENDOSCOPY;  Service: Endoscopy;  Laterality: N/A;   TONSILLECTOMY AND ADENOIDECTOMY      OB History     Gravida  0   Para  0   Term  0   Preterm  0   AB  0   Living  0      SAB  0   IAB  0   Ectopic  0   Multiple  0   Live Births  0            Home Medications    Prior to Admission medications  Medication Sig Start Date End Date Taking? Authorizing Provider  baclofen  (LIORESAL ) 10 MG tablet Take 1 tablet (10 mg total) by mouth 3 (three) times daily. 03/23/24  Yes Bernardino Ditch, NP  ibuprofen  (ADVIL ) 600 MG tablet Take 1 tablet (600 mg total) by mouth every 6 (six) hours as needed. 03/23/24  Yes Bernardino Ditch, NP  FLUoxetine  (PROZAC ) 20 MG capsule Take 1 capsule (20 mg total) by mouth daily. 09/11/23   Simmons-Robinson, Rockie, MD  metFORMIN  (GLUCOPHAGE  XR) 750 MG 24 hr tablet Take 1 tablet (750 mg total) by mouth 2 (two) times daily with breakfast  and lunch. 11/06/23   Simmons-Robinson, Rockie, MD  Nitroglycerin  0.4 % OINT Apply 1 inch (375 mg) ointment intra-anally every 12 hours for anal fissure 11/06/23   Simmons-Robinson, Makiera, MD  phentermine  37.5 MG capsule Take 1 capsule (37.5 mg total) by mouth every morning. 02/03/24   Simmons-Robinson, Rockie, MD  topiramate  (TOPAMAX ) 50 MG tablet Take 1 tablet (50 mg total) by mouth daily. 11/06/23   Simmons-Robinson, Rockie, MD  traZODone  (DESYREL ) 50 MG tablet Take 0.5-1 tablets (25-50 mg total) by mouth at bedtime as needed for sleep. 11/06/23   Simmons-Robinson, Rockie, MD    Family History Family History  Problem Relation Age of Onset   Diabetes Mother        Type 2    Hypertension Mother    Hyperlipidemia Mother    Kidney Stones Father    Ovarian cancer Paternal Aunt        early 69s, has contact   Cancer Paternal Grandfather        Liver   Hypertension Paternal Grandfather     Social History Social History[1]   Allergies   Patient has no known allergies.   Review of Systems Review of Systems  Constitutional:  Negative for diaphoresis and fever.  Gastrointestinal:  Positive for abdominal pain. Negative for nausea and vomiting.  Genitourinary:  Positive for flank pain. Negative for dysuria, frequency, hematuria and urgency.  Musculoskeletal:  Negative for back pain.     Physical Exam Triage Vital Signs ED Triage Vitals  Encounter Vitals Group     BP      Girls Systolic BP Percentile      Girls Diastolic BP Percentile      Boys Systolic BP Percentile      Boys Diastolic BP Percentile      Pulse      Resp      Temp      Temp src      SpO2      Weight      Height      Head Circumference      Peak Flow      Pain Score      Pain Loc      Pain Education      Exclude from Growth Chart    No data found.  Updated Vital Signs BP 120/83 (BP Location: Left Arm)   Pulse 82   Temp 98.3 F (36.8 C) (Oral)   Resp 16   Wt (!) 312 lb 12.8 oz (141.9 kg)   LMP 01/09/2024 Comment: denies preg, signed preg waiver. polycystic ovarian disease  SpO2 98%   BMI 46.19 kg/m   Visual Acuity Right Eye Distance:   Left Eye Distance:   Bilateral Distance:    Right Eye Near:   Left Eye Near:    Bilateral Near:     Physical Exam Vitals and nursing note reviewed.  Constitutional:      Appearance: Normal appearance. She is not ill-appearing.  HENT:     Head: Normocephalic and atraumatic.  Cardiovascular:     Rate and Rhythm: Normal rate and regular rhythm.     Pulses: Normal pulses.     Heart sounds: Normal heart sounds. No murmur heard.    No friction rub. No gallop.  Pulmonary:     Effort: Pulmonary effort is normal.     Breath  sounds: Normal breath sounds. No wheezing, rhonchi or rales.  Abdominal:     General: Abdomen is flat.  Palpations: Abdomen is soft.     Tenderness: There is no abdominal tenderness. There is no right CVA tenderness, left CVA tenderness, guarding or rebound.  Skin:    General: Skin is warm and dry.     Capillary Refill: Capillary refill takes less than 2 seconds.     Findings: No rash.  Neurological:     General: No focal deficit present.     Mental Status: She is alert and oriented to person, place, and time.      UC Treatments / Results  Labs (all labs ordered are listed, but only abnormal results are displayed) Labs Reviewed  POCT URINE DIPSTICK - Abnormal; Notable for the following components:      Result Value   Leukocytes, UA Trace (*)    All other components within normal limits  URINE CULTURE    EKG   Radiology DG Abd 1 View Result Date: 03/23/2024 EXAM: 1 VIEW XRAY OF THE ABDOMEN 03/23/2024 08:54:37 AM COMPARISON: None available. CLINICAL HISTORY: Right flank pain. FINDINGS: BOWEL: Nonobstructive bowel gas pattern. SOFT TISSUES: Surgical clips are present in the gallbladder fossa. There are no calculi present overlying the kidneys, ureters or urinary bladder. BONES: No acute fracture. IMPRESSION: 1. No radiopaque urinary tract calculus is identified. 2. Postsurgical clips in the gallbladder fossa, consistent with prior cholecystectomy. Electronically signed by: Evalene Coho MD 03/23/2024 09:15 AM EST RP Workstation: [id number]    Procedures Procedures (including critical care time)  Medications Ordered in UC Medications - No data to display  Initial Impression / Assessment and Plan / UC Course  I have reviewed the triage vital signs and the nursing notes.  Pertinent labs & imaging results that were available during my care of the patient were reviewed by me and considered in my medical decision making (see chart for details).   Patient is a  nontoxic-appearing 34 year old female presenting for evaluation of intermittent right flank pain that radiates around to the right side of her abdomen as outlined in HPI above.  She is not currently experiencing any pain.  She has no CVA tenderness on exam.  Her abdomen is soft and nontender.  Differential diagnose include renal stone, UTI, musculoskeletal back pain.  She has had a cholecystectomy so I do not suspect gallstones.  The pain also does not increase with eating.  I will order a UA to assess for UTI and KUB to assess for the presence of stone.  KUB independently reviewed and evaluated by me.  Impression: Nonobstructive bowel gas pattern noted with scattered stool throughout the colon.  No radiopaque calculi noted in either kidney or along the projected path of either ureter.  Radiology overread is pending. Radiology impression states no radiopaque urinary tract calculus is identified.  Postsurgical clips of the gallbladder fossa consistent with prior cholecystectomy.  Urine dip shows trace leukocyte esterase but is negative for nitrites, protein.  I will send urine for culture.  I will discharge patient home with a diagnosis of right flank pain and treated as if it is musculoskeletal.  I will start her on 600 mg ibuprofen  every 6 hours along with 10 mg of baclofen  every 8 hours.  Moist heat application to improve blood flow to the muscle group as well as home physical therapy exercises.  Return precautions reviewed.  Work note provided.   Final Clinical Impressions(s) / UC Diagnoses   Final diagnoses:  Right flank pain     Discharge Instructions      Your urine showed trace  leukocyte esterase which may be skin contamination.  No other evidence of UTI and no blood to suggest a kidney stone.  However, I will send the urine for culture just to rule out a subclinical infection.  Your x-ray did not show any evidence of kidney stone or other concerning process which could explain your  pain.  Given the intermittent nature of your pain, and the fact that it increases with movement, it may be deep musculoskeletal.  Take the ibuprofen , 600 mg every 6 hours with food, on a schedule for the next 48 hours and then as needed.  Take the ibuprofen  1 hour apart from your Prozac .  Take the baclofen , 10 mg every 8 hours, on a schedule for the next 48 hours and then as needed.  Apply moist heat to your back for 30 minutes at a time 2-3 times a day to improve blood flow to the area and help remove the lactic acid causing the spasm.  Follow the back exercises given at discharge.  Return for reevaluation for any new or worsening symptoms.      ED Prescriptions     Medication Sig Dispense Auth. Provider   ibuprofen  (ADVIL ) 600 MG tablet Take 1 tablet (600 mg total) by mouth every 6 (six) hours as needed. 30 tablet Bernardino Ditch, NP   baclofen  (LIORESAL ) 10 MG tablet Take 1 tablet (10 mg total) by mouth 3 (three) times daily. 30 each Bernardino Ditch, NP      PDMP not reviewed this encounter.     [1]  Social History Tobacco Use   Smoking status: Former   Smokeless tobacco: Never   Tobacco comments:    Per patient she has not smoke for the past six months  Vaping Use   Vaping status: Never Used  Substance Use Topics   Alcohol use: Yes    Alcohol/week: 1.0 standard drink of alcohol    Types: 1 Shots of liquor per week    Comment: occasional   Drug use: No     Bernardino Ditch, NP 03/23/24 [phone]  "

## 2024-03-24 ENCOUNTER — Ambulatory Visit (HOSPITAL_COMMUNITY): Payer: Self-pay

## 2024-03-24 LAB — URINE CULTURE: Special Requests: NORMAL

## 2024-03-25 NOTE — Telephone Encounter (Signed)
 Reviewed telephone report   Agree with urgent evaluation as recommended

## 2024-04-23 ENCOUNTER — Ambulatory Visit: Admitting: Family Medicine

## 2024-08-25 ENCOUNTER — Encounter

## 2024-08-25 ENCOUNTER — Other Ambulatory Visit
